# Patient Record
Sex: Female | Born: 1975
Health system: Southern US, Community
[De-identification: ages and names within clinical notes are randomized; demographics above are authoritative.]

## PROBLEM LIST (undated history)

## (undated) DIAGNOSIS — N189 Chronic kidney disease, unspecified: Secondary | ICD-10-CM

## (undated) DIAGNOSIS — R51 Headache: Secondary | ICD-10-CM

## (undated) DIAGNOSIS — F419 Anxiety disorder, unspecified: Secondary | ICD-10-CM

## (undated) DIAGNOSIS — K5790 Diverticulosis of intestine, part unspecified, without perforation or abscess without bleeding: Secondary | ICD-10-CM

## (undated) DIAGNOSIS — A630 Anogenital (venereal) warts: Secondary | ICD-10-CM

## (undated) DIAGNOSIS — G8929 Other chronic pain: Secondary | ICD-10-CM

## (undated) DIAGNOSIS — M1990 Unspecified osteoarthritis, unspecified site: Secondary | ICD-10-CM

## (undated) DIAGNOSIS — L905 Scar conditions and fibrosis of skin: Secondary | ICD-10-CM

## (undated) DIAGNOSIS — Z860100 Personal history of colon polyps, unspecified: Secondary | ICD-10-CM

## (undated) DIAGNOSIS — T7840XA Allergy, unspecified, initial encounter: Secondary | ICD-10-CM

## (undated) DIAGNOSIS — R519 Headache, unspecified: Secondary | ICD-10-CM

## (undated) HISTORY — PX: DENTAL SURGERY: SHX609

## (undated) HISTORY — DX: Other chronic pain: G89.29

## (undated) HISTORY — PX: OTHER SURGICAL HISTORY: SHX169

## (undated) HISTORY — DX: Chronic kidney disease, unspecified: N18.9

## (undated) HISTORY — DX: Headache: R51

## (undated) HISTORY — DX: Unspecified osteoarthritis, unspecified site: M19.90

## (undated) HISTORY — PX: BIOPSY BOWEL: PRO7

## (undated) HISTORY — DX: Headache, unspecified: R51.9

## (undated) HISTORY — DX: Anxiety disorder, unspecified: F41.9

## (undated) HISTORY — DX: Scar conditions and fibrosis of skin: L90.5

## (undated) HISTORY — DX: Allergy, unspecified, initial encounter: T78.40XA

## (undated) HISTORY — DX: Diverticulosis of intestine, part unspecified, without perforation or abscess without bleeding: K57.90

---
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
## 1998-07-25 ENCOUNTER — Emergency Department (HOSPITAL_COMMUNITY): Admission: EM | Admit: 1998-07-25 | Discharge: 1998-07-25 | Payer: Self-pay | Admitting: *Deleted

## 1998-09-03 ENCOUNTER — Ambulatory Visit (HOSPITAL_COMMUNITY): Admission: RE | Admit: 1998-09-03 | Discharge: 1998-09-03 | Payer: Self-pay | Admitting: Obstetrics

## 1998-09-13 ENCOUNTER — Inpatient Hospital Stay (HOSPITAL_COMMUNITY): Admission: AD | Admit: 1998-09-13 | Discharge: 1998-09-13 | Payer: Self-pay | Admitting: *Deleted

## 1998-09-27 ENCOUNTER — Ambulatory Visit (HOSPITAL_COMMUNITY): Admission: RE | Admit: 1998-09-27 | Discharge: 1998-09-27 | Payer: Self-pay | Admitting: *Deleted

## 1998-10-27 ENCOUNTER — Inpatient Hospital Stay (HOSPITAL_COMMUNITY): Admission: AD | Admit: 1998-10-27 | Discharge: 1998-10-29 | Payer: Self-pay | Admitting: *Deleted

## 1998-11-01 ENCOUNTER — Inpatient Hospital Stay (HOSPITAL_COMMUNITY): Admission: AD | Admit: 1998-11-01 | Discharge: 1998-11-17 | Payer: Self-pay | Admitting: Obstetrics & Gynecology

## 1998-11-01 ENCOUNTER — Encounter: Payer: Self-pay | Admitting: Obstetrics & Gynecology

## 1999-02-01 ENCOUNTER — Encounter: Admission: RE | Admit: 1999-02-01 | Discharge: 1999-02-01 | Payer: Self-pay | Admitting: Obstetrics & Gynecology

## 1999-02-18 ENCOUNTER — Ambulatory Visit (HOSPITAL_COMMUNITY): Admission: RE | Admit: 1999-02-18 | Discharge: 1999-02-18 | Payer: Self-pay | Admitting: Obstetrics & Gynecology

## 1999-03-06 ENCOUNTER — Inpatient Hospital Stay (HOSPITAL_COMMUNITY): Admission: AD | Admit: 1999-03-06 | Discharge: 1999-03-06 | Payer: Self-pay | Admitting: Obstetrics

## 2000-01-25 ENCOUNTER — Encounter: Payer: Self-pay | Admitting: Emergency Medicine

## 2000-01-25 ENCOUNTER — Emergency Department (HOSPITAL_COMMUNITY): Admission: EM | Admit: 2000-01-25 | Discharge: 2000-01-25 | Payer: Self-pay | Admitting: Emergency Medicine

## 2000-02-12 ENCOUNTER — Encounter: Admission: RE | Admit: 2000-02-12 | Discharge: 2000-02-12 | Payer: Self-pay | Admitting: Family Medicine

## 2000-04-06 ENCOUNTER — Encounter: Admission: RE | Admit: 2000-04-06 | Discharge: 2000-04-20 | Payer: Self-pay | Admitting: Orthopedic Surgery

## 2000-04-27 ENCOUNTER — Encounter: Admission: RE | Admit: 2000-04-27 | Discharge: 2000-04-27 | Payer: Self-pay | Admitting: Family Medicine

## 2000-10-05 ENCOUNTER — Encounter: Admission: RE | Admit: 2000-10-05 | Discharge: 2000-10-05 | Payer: Self-pay | Admitting: Family Medicine

## 2000-10-09 ENCOUNTER — Encounter: Admission: RE | Admit: 2000-10-09 | Discharge: 2000-10-09 | Payer: Self-pay | Admitting: Family Medicine

## 2000-12-26 ENCOUNTER — Emergency Department (HOSPITAL_COMMUNITY): Admission: EM | Admit: 2000-12-26 | Discharge: 2000-12-26 | Payer: Self-pay | Admitting: Emergency Medicine

## 2000-12-27 ENCOUNTER — Emergency Department (HOSPITAL_COMMUNITY): Admission: EM | Admit: 2000-12-27 | Discharge: 2000-12-27 | Payer: Self-pay | Admitting: Emergency Medicine

## 2000-12-28 ENCOUNTER — Encounter: Admission: RE | Admit: 2000-12-28 | Discharge: 2000-12-28 | Payer: Self-pay | Admitting: Family Medicine

## 2000-12-31 ENCOUNTER — Encounter: Admission: RE | Admit: 2000-12-31 | Discharge: 2000-12-31 | Payer: Self-pay | Admitting: Family Medicine

## 2001-01-04 ENCOUNTER — Encounter: Admission: RE | Admit: 2001-01-04 | Discharge: 2001-01-04 | Payer: Self-pay | Admitting: Sports Medicine

## 2001-03-16 ENCOUNTER — Emergency Department (HOSPITAL_COMMUNITY): Admission: EM | Admit: 2001-03-16 | Discharge: 2001-03-16 | Payer: Self-pay | Admitting: Emergency Medicine

## 2001-04-27 ENCOUNTER — Encounter: Admission: RE | Admit: 2001-04-27 | Discharge: 2001-04-27 | Payer: Self-pay | Admitting: Family Medicine

## 2001-07-30 ENCOUNTER — Encounter: Admission: RE | Admit: 2001-07-30 | Discharge: 2001-07-30 | Payer: Self-pay | Admitting: Family Medicine

## 2001-10-20 ENCOUNTER — Encounter: Payer: Self-pay | Admitting: *Deleted

## 2001-10-20 ENCOUNTER — Encounter: Admission: RE | Admit: 2001-10-20 | Discharge: 2001-10-20 | Payer: Self-pay | Admitting: *Deleted

## 2001-12-01 ENCOUNTER — Encounter: Payer: Self-pay | Admitting: Emergency Medicine

## 2001-12-01 ENCOUNTER — Emergency Department (HOSPITAL_COMMUNITY): Admission: EM | Admit: 2001-12-01 | Discharge: 2001-12-01 | Payer: Self-pay | Admitting: Emergency Medicine

## 2002-03-22 ENCOUNTER — Encounter: Admission: RE | Admit: 2002-03-22 | Discharge: 2002-03-22 | Payer: Self-pay | Admitting: Family Medicine

## 2002-07-26 ENCOUNTER — Emergency Department (HOSPITAL_COMMUNITY): Admission: EM | Admit: 2002-07-26 | Discharge: 2002-07-26 | Payer: Self-pay | Admitting: Emergency Medicine

## 2003-01-13 ENCOUNTER — Emergency Department (HOSPITAL_COMMUNITY): Admission: EM | Admit: 2003-01-13 | Discharge: 2003-01-13 | Payer: Self-pay | Admitting: Emergency Medicine

## 2003-01-13 ENCOUNTER — Encounter: Admission: RE | Admit: 2003-01-13 | Discharge: 2003-01-13 | Payer: Self-pay | Admitting: Family Medicine

## 2003-01-13 ENCOUNTER — Encounter: Payer: Self-pay | Admitting: Emergency Medicine

## 2003-04-11 ENCOUNTER — Emergency Department (HOSPITAL_COMMUNITY): Admission: EM | Admit: 2003-04-11 | Discharge: 2003-04-11 | Payer: Self-pay | Admitting: Emergency Medicine

## 2003-08-02 ENCOUNTER — Encounter: Admission: RE | Admit: 2003-08-02 | Discharge: 2003-08-02 | Payer: Self-pay | Admitting: Family Medicine

## 2003-12-20 ENCOUNTER — Emergency Department (HOSPITAL_COMMUNITY): Admission: EM | Admit: 2003-12-20 | Discharge: 2003-12-20 | Payer: Self-pay | Admitting: Emergency Medicine

## 2004-01-01 ENCOUNTER — Encounter: Admission: RE | Admit: 2004-01-01 | Discharge: 2004-01-01 | Payer: Self-pay | Admitting: Family Medicine

## 2004-01-08 ENCOUNTER — Encounter: Admission: RE | Admit: 2004-01-08 | Discharge: 2004-01-08 | Payer: Self-pay | Admitting: Family Medicine

## 2004-06-18 ENCOUNTER — Encounter: Admission: RE | Admit: 2004-06-18 | Discharge: 2004-06-18 | Payer: Self-pay | Admitting: Sports Medicine

## 2004-07-19 ENCOUNTER — Encounter: Admission: RE | Admit: 2004-07-19 | Discharge: 2004-07-19 | Payer: Self-pay | Admitting: Family Medicine

## 2004-07-19 ENCOUNTER — Ambulatory Visit: Payer: Self-pay | Admitting: Family Medicine

## 2004-07-23 ENCOUNTER — Encounter: Admission: RE | Admit: 2004-07-23 | Discharge: 2004-07-23 | Payer: Self-pay | Admitting: Family Medicine

## 2004-10-24 ENCOUNTER — Ambulatory Visit: Payer: Self-pay | Admitting: Family Medicine

## 2004-11-19 ENCOUNTER — Ambulatory Visit: Payer: Self-pay | Admitting: Sports Medicine

## 2005-01-11 ENCOUNTER — Emergency Department (HOSPITAL_COMMUNITY): Admission: EM | Admit: 2005-01-11 | Discharge: 2005-01-11 | Payer: Self-pay | Admitting: Family Medicine

## 2005-02-25 ENCOUNTER — Encounter: Admission: RE | Admit: 2005-02-25 | Discharge: 2005-02-25 | Payer: Self-pay | Admitting: Family Medicine

## 2005-06-26 ENCOUNTER — Encounter: Admission: RE | Admit: 2005-06-26 | Discharge: 2005-06-26 | Payer: Self-pay | Admitting: Occupational Medicine

## 2005-07-02 ENCOUNTER — Emergency Department (HOSPITAL_COMMUNITY): Admission: EM | Admit: 2005-07-02 | Discharge: 2005-07-02 | Payer: Self-pay | Admitting: Emergency Medicine

## 2005-09-25 ENCOUNTER — Ambulatory Visit: Payer: Self-pay | Admitting: Family Medicine

## 2005-10-23 ENCOUNTER — Ambulatory Visit: Payer: Self-pay | Admitting: Family Medicine

## 2005-12-15 ENCOUNTER — Encounter (INDEPENDENT_AMBULATORY_CARE_PROVIDER_SITE_OTHER): Payer: Self-pay | Admitting: *Deleted

## 2005-12-15 LAB — CONVERTED CEMR LAB

## 2006-01-05 ENCOUNTER — Ambulatory Visit: Payer: Self-pay | Admitting: Sports Medicine

## 2006-02-01 ENCOUNTER — Emergency Department (HOSPITAL_COMMUNITY): Admission: EM | Admit: 2006-02-01 | Discharge: 2006-02-01 | Payer: Self-pay | Admitting: Family Medicine

## 2006-02-02 ENCOUNTER — Emergency Department (HOSPITAL_COMMUNITY): Admission: EM | Admit: 2006-02-02 | Discharge: 2006-02-02 | Payer: Self-pay | Admitting: Emergency Medicine

## 2006-06-25 ENCOUNTER — Emergency Department (HOSPITAL_COMMUNITY): Admission: EM | Admit: 2006-06-25 | Discharge: 2006-06-25 | Payer: Self-pay | Admitting: Family Medicine

## 2006-12-17 ENCOUNTER — Ambulatory Visit: Payer: Self-pay | Admitting: Family Medicine

## 2006-12-17 ENCOUNTER — Encounter: Admission: RE | Admit: 2006-12-17 | Discharge: 2006-12-17 | Payer: Self-pay | Admitting: Family Medicine

## 2006-12-23 ENCOUNTER — Ambulatory Visit: Payer: Self-pay | Admitting: Family Medicine

## 2007-02-11 DIAGNOSIS — R519 Headache, unspecified: Secondary | ICD-10-CM | POA: Insufficient documentation

## 2007-02-11 DIAGNOSIS — L732 Hidradenitis suppurativa: Secondary | ICD-10-CM

## 2007-02-11 DIAGNOSIS — F17200 Nicotine dependence, unspecified, uncomplicated: Secondary | ICD-10-CM | POA: Insufficient documentation

## 2007-02-11 DIAGNOSIS — F411 Generalized anxiety disorder: Secondary | ICD-10-CM | POA: Insufficient documentation

## 2007-02-11 DIAGNOSIS — R51 Headache: Secondary | ICD-10-CM

## 2007-02-11 DIAGNOSIS — J309 Allergic rhinitis, unspecified: Secondary | ICD-10-CM | POA: Insufficient documentation

## 2007-02-11 DIAGNOSIS — M542 Cervicalgia: Secondary | ICD-10-CM | POA: Insufficient documentation

## 2007-02-12 ENCOUNTER — Encounter (INDEPENDENT_AMBULATORY_CARE_PROVIDER_SITE_OTHER): Payer: Self-pay | Admitting: *Deleted

## 2007-03-19 ENCOUNTER — Emergency Department (HOSPITAL_COMMUNITY): Admission: EM | Admit: 2007-03-19 | Discharge: 2007-03-19 | Payer: Self-pay | Admitting: Family Medicine

## 2007-12-15 ENCOUNTER — Ambulatory Visit: Payer: Self-pay | Admitting: Family Medicine

## 2007-12-15 DIAGNOSIS — L989 Disorder of the skin and subcutaneous tissue, unspecified: Secondary | ICD-10-CM | POA: Insufficient documentation

## 2007-12-27 ENCOUNTER — Telehealth: Payer: Self-pay | Admitting: *Deleted

## 2007-12-28 ENCOUNTER — Encounter (INDEPENDENT_AMBULATORY_CARE_PROVIDER_SITE_OTHER): Payer: Self-pay | Admitting: Family Medicine

## 2008-01-03 ENCOUNTER — Encounter (INDEPENDENT_AMBULATORY_CARE_PROVIDER_SITE_OTHER): Payer: Self-pay | Admitting: Family Medicine

## 2008-12-30 ENCOUNTER — Emergency Department (HOSPITAL_COMMUNITY): Admission: EM | Admit: 2008-12-30 | Discharge: 2008-12-30 | Payer: Self-pay | Admitting: Emergency Medicine

## 2009-01-04 ENCOUNTER — Emergency Department (HOSPITAL_COMMUNITY): Admission: EM | Admit: 2009-01-04 | Discharge: 2009-01-04 | Payer: Self-pay | Admitting: Emergency Medicine

## 2009-04-19 ENCOUNTER — Telehealth: Payer: Self-pay | Admitting: *Deleted

## 2009-05-02 ENCOUNTER — Ambulatory Visit: Payer: Self-pay | Admitting: Family Medicine

## 2009-05-02 ENCOUNTER — Encounter (INDEPENDENT_AMBULATORY_CARE_PROVIDER_SITE_OTHER): Payer: Self-pay | Admitting: Family Medicine

## 2009-05-02 ENCOUNTER — Encounter: Admission: RE | Admit: 2009-05-02 | Discharge: 2009-05-02 | Payer: Self-pay | Admitting: Family Medicine

## 2009-05-02 DIAGNOSIS — R109 Unspecified abdominal pain: Secondary | ICD-10-CM

## 2009-05-02 LAB — CONVERTED CEMR LAB
Bilirubin Urine: NEGATIVE
Nitrite: NEGATIVE
Protein, U semiquant: NEGATIVE
Urobilinogen, UA: 0.2

## 2009-05-19 ENCOUNTER — Emergency Department (HOSPITAL_COMMUNITY): Admission: EM | Admit: 2009-05-19 | Discharge: 2009-05-20 | Payer: Self-pay | Admitting: Emergency Medicine

## 2010-06-29 ENCOUNTER — Emergency Department (HOSPITAL_COMMUNITY): Admission: EM | Admit: 2010-06-29 | Discharge: 2010-06-30 | Payer: Self-pay | Admitting: Emergency Medicine

## 2010-08-26 ENCOUNTER — Encounter: Payer: Self-pay | Admitting: Family Medicine

## 2010-08-27 ENCOUNTER — Ambulatory Visit: Payer: Self-pay | Admitting: Family Medicine

## 2010-08-27 ENCOUNTER — Encounter: Admission: RE | Admit: 2010-08-27 | Discharge: 2010-08-27 | Payer: Self-pay | Admitting: Sports Medicine

## 2010-08-27 DIAGNOSIS — IMO0002 Reserved for concepts with insufficient information to code with codable children: Secondary | ICD-10-CM | POA: Insufficient documentation

## 2010-08-27 DIAGNOSIS — G5620 Lesion of ulnar nerve, unspecified upper limb: Secondary | ICD-10-CM

## 2010-09-04 ENCOUNTER — Telehealth: Payer: Self-pay | Admitting: *Deleted

## 2010-09-10 ENCOUNTER — Ambulatory Visit: Payer: Self-pay | Admitting: Family Medicine

## 2010-11-24 ENCOUNTER — Emergency Department (HOSPITAL_COMMUNITY)
Admission: EM | Admit: 2010-11-24 | Discharge: 2010-11-25 | Payer: Self-pay | Source: Home / Self Care | Admitting: Emergency Medicine

## 2010-11-27 ENCOUNTER — Emergency Department (HOSPITAL_COMMUNITY)
Admission: EM | Admit: 2010-11-27 | Discharge: 2010-11-27 | Payer: Self-pay | Source: Home / Self Care | Admitting: Emergency Medicine

## 2010-12-03 ENCOUNTER — Ambulatory Visit: Payer: Self-pay | Admitting: Family Medicine

## 2010-12-03 DIAGNOSIS — A088 Other specified intestinal infections: Secondary | ICD-10-CM | POA: Insufficient documentation

## 2011-01-14 NOTE — Miscellaneous (Signed)
Summary: back pain  Clinical Lists Changes states she has terrible back pain but has not been able to come in due to lack of insurance. now has medicaid . has been using ibuprofen for pain. no appt left until tomorrow. she will be here at 8:30.  she declined use of UC.Golden Circle RN  August 26, 2010 11:26 AM  Will follow in clinic. Bobby Rumpf  MD  August 26, 2010 11:44 AM   Appended Document: back pain she did not keep appt this am. states she overslept. resched for 9:45 with Dr. Karie Schwalbe & discussed the dnkas again. told her she would be getting a probation letter

## 2011-01-14 NOTE — Progress Notes (Signed)
  Phone Note Call from Patient   Caller: Patient Call For: [phone] Summary of Call: Please call pt back with Xray results. Initial call taken by: Britta Mccreedy mcgregor  Follow-up for Phone Call        Mild arthritis.  Nothing else.  Should do what we discussed: Pred 12 day pack. Neurontin up-taper. Rehab exercises printed from sports med advisor. RTC 2 weeks to see how she's doing. Follow-up by: Rodney Langton MD,  September 04, 2010 8:52 PM  Additional Follow-up for Phone Call Additional follow up Details #1::        gave pt above info. she had no further questions Additional Follow-up by: Golden Circle RN,  September 05, 2010 9:19 AM

## 2011-01-14 NOTE — Assessment & Plan Note (Signed)
Summary: F/U PER DR T/KH   Vital Signs:  Patient profile:   35 year old female Weight:      180.5 pounds Temp:     98.6 degrees F oral Pulse rate:   88 / minute Pulse rhythm:   regular BP sitting:   121 / 92  (right arm) Cuff size:   regular  Vitals Entered By: Loralee Pacas CMA (September 10, 2010 2:06 PM)   Primary Care Provider:  Bobby Rumpf  MD   History of Present Illness: 35 yo female here for fu of radicular LBP.  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  Pain is worst at R low back paraspinal with paresthesias going down lateral aspect of R thigh.  No symptoms going down into foot.  No trauma.  No bowel/bladder problems.  Pain worse with spine extension but also present with flexion.  Has been using aspirin only.  No weakness in leg.    XR showed mild degenerative changes, zygapophyseal joints aligned, neural foramina open on oblique views.  Mild anterior lipping/degenerativechanges.  Finishing prednisone taper and on neurontin.  Symptoms only a little better.  Finds that rehab exercises help the most.  Habits & Providers  Alcohol-Tobacco-Diet     Tobacco Status: current     Tobacco Counseling: to quit use of tobacco products     Cigarette Packs/Day: 1.0  Current Medications (verified): 1)  Allegra-D 12 Hour 60-120 Mg Tb12 (Fexofenadine-Pseudoephedrine) .... Take 1 Tablet By Mouth Twice A Day 2)  Promethazine Hcl 12.5 Mg  Tabs (Promethazine Hcl) .... Take One Tablet Every 6-8 Hours As Needed Nausea. 3)  Midrin 325-65-100 Mg  Caps (Apap-Isometheptene-Dichloral) .... Take 2 Capsule By Mouth X 1 Then One Capule Every Hour As Needed To A Max of 5 Per 12 Hours 4)  Prednisone (Pak) 10 Mg Tabs (Prednisone) .... Use 12 Day Pack As Directed. 5)  Neurontin 300 Mg Caps (Gabapentin) .... One Tab By Mouth Qam, One Tab By Mouth At Lunch, Then 2 Tabs At Bedtime. 6)  Amitriptyline Hcl 25 Mg Tabs (Amitriptyline Hcl) .... One Tab By Mouth Qhs X  7d, Then Increase To 50mg  Tabs Qhs. 7)  Amitriptyline Hcl 50 Mg Tabs (Amitriptyline Hcl) .... One Tab By Mouth Qhs  Allergies (verified): No Known Drug Allergies  Review of Systems       See HPI  Physical Exam  General:  Well-developed,well-nourished,in no acute distress; alert,appropriate and cooperative throughout examination Msk:  Normal to inspection, TTP R paraspinal muscles.  DTRs 2+in lower ext.  Strength 5/5 in lower ext, gait normal.  Seated and laying straight leg raise reproduce symptoms of paresthesias.  Extreme flexion of hip actually improves pain.  Pt has approx 5 deg of extension limited by pain.  Flexion to 90 deg.  Side bending and rotation normal.   Impression & Recommendations:  Problem # 1:  BACK PAIN, LUMBAR, WITH RADICULOPATHY (ICD-724.4) Assessment Improved Radiculopathy in L3/L4 distribution/lateral femoral cutaneous nerve, XR not suggestive of foraminal stenosis in the area. Symptoms improved with treatment. Pt unable to do light duty at work. Will further increase neurontin to 300, 300, 600 mg. Adding amytryptiline. RTC 2 weeks.  Orders: [facility]- Est  Level 4 (16109)  Problem # 2:  CUBITAL TUNNEL SYNDROME (ICD-354.2) Assessment: Improved Somewhat improved.  Orders: [facility]- Est  Level 4 (99214)  Complete Medication List: 1)  Allegra-d 12 Hour 60-120 Mg Tb12 (Fexofenadine-pseudoephedrine) .... Take 1 tablet by mouth twice  a day 2)  Promethazine Hcl 12.5 Mg Tabs (Promethazine hcl) .... Take one tablet every 6-8 hours as needed nausea. 3)  Midrin 325-65-100 Mg Caps (Apap-isometheptene-dichloral) .... Take 2 capsule by mouth x 1 then one capule every hour as needed to a max of 5 per 12 hours 4)  Prednisone (pak) 10 Mg Tabs (Prednisone) .... Use 12 day pack as directed. 5)  Neurontin 300 Mg Caps (Gabapentin) .... One tab by mouth qam, one tab by mouth at lunch, then 2 tabs at bedtime. 6)  Amitriptyline Hcl 25 Mg Tabs (Amitriptyline hcl) .... One tab by mouth  qhs x 7d, then increase to 50mg  tabs qhs. 7)  Amitriptyline Hcl 50 Mg Tabs (Amitriptyline hcl) .... One tab by mouth qhs  Patient Instructions: 1)  Great to see you, 2)  Increase your Neurontin to 1 tab in the morning, 1 midday, and 2 at bedtime. 3)  Start taking amitryptiline as below. 4)  Call and let me know how you are doing in 2 weeks. 5)  -Dr. Karie Schwalbe. Prescriptions: AMITRIPTYLINE HCL 50 MG TABS (AMITRIPTYLINE HCL) One tab by mouth qHS  #60 x 0   Entered and Authorized by:   Rodney Langton MD   Signed by:   Rodney Langton MD on 09/10/2010   Method used:   Electronically to        [facility]  [address] [phone][facility] (retail)       [address]       [place], [place]  [address]       Ph: [phone]       Fax: [phone]   RxID:   [id number] AMITRIPTYLINE HCL 25 MG TABS (AMITRIPTYLINE HCL) One tab by mouth qHS x 7d, then increase to 50mg  tabs qHS.  #7 x 0   Entered and Authorized by:   Rodney Langton MD   Signed by:   Rodney Langton MD on 09/10/2010   Method used:   [facility]        [facility]  [address] [phone]* (retail)       [address]       [place], [place]  [address]       Ph: [phone]       Fax: [phone]   RxID:   [id number]

## 2011-01-14 NOTE — Assessment & Plan Note (Signed)
Summary: back pain/[place]/carew   Vital Signs:  Patient profile:   35 year old female Height:      64 inches Weight:      183 pounds BMI:     31.53 Temp:     97.6 degrees F oral Pulse rate:   87 / minute BP sitting:   113 / 79  (left arm) Cuff size:   regular  Vitals Entered By: Garen Grams LPN (August 27, 2010 9:54 AM) CC: low back pain x 1 yr Is Patient Diabetic? No Pain Assessment Patient in pain? yes     Location: lower back Intensity: 7   Primary Care Provider:  Alanda Amass MD  CC:  low back pain x 1 yr.  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  Pain is worst at R low back paraspinal with paresthesias going down lateral aspect of R thigh for the last month.  No symptoms going down into foot.  No trauma.  No bowel/bladder problems.  Pain worse with spine extension but also present with flexion.  Has been using aspirin only.  No weakness in leg.    Also wakes up with L pinkie and ring fingers numb.  Habits & Providers  Alcohol-Tobacco-Diet     Tobacco Status: current     Tobacco Counseling: to quit use of tobacco products     Cigarette Packs/Day: 1.0  Current Medications (verified): 1)  Allegra-D 12 Hour 60-120 Mg Tb12 (Fexofenadine-Pseudoephedrine) .... Take 1 Tablet By Mouth Twice A Day 2)  Promethazine Hcl 12.5 Mg  Tabs (Promethazine Hcl) .... Take One Tablet Every 6-8 Hours As Needed Nausea. 3)  Midrin 325-65-100 Mg  Caps (Apap-Isometheptene-Dichloral) .... Take 2 Capsule By Mouth X 1 Then One Capule Every Hour As Needed To A Max of 5 Per 12 Hours 4)  Prednisone (Pak) 10 Mg Tabs (Prednisone) .... Use 12 Day Pack As Directed. 5)  Neurontin 300 Mg Caps (Gabapentin) .... One Tab By Mouth Qhs On Day 1, Then By Mouth Two Times A Day On Day 2, Then Three Times A Day.  Allergies (verified): No Known Drug Allergies  Social History: Packs/Day:  1.0  Review of Systems       See  HPI  Physical Exam  General:  Well-developed,well-nourished,in no acute distress; alert,appropriate and cooperative throughout examination Lungs:  Normal respiratory effort, chest expands symmetrically. Lungs are clear to auscultation, no crackles or wheezes. Heart:  Normal rate and regular rhythm. S1 and S2 normal without gallop, murmur, click, rub or other extra sounds. Msk:  Normal to inspection, TTP R paraspinal muscles.  DTRs 2+in lower ext.  Strength 5/5 in lower ext, gait normal.  Seated and laying straight leg raise reproduce symptoms of paresthesias.  Extreme flexion of hip actually improves pain.  Pt has approx 5 deg of extension limited by pain.  Flexion to 90 deg.  Side bending and rotation normal.    Impression & Recommendations:  Problem # 1:  BACK PAIN, LUMBAR, WITH RADICULOPATHY (ICD-724.4) Assessment New Radicular pain in lateral cutaneous nerve of the thigh distribution.  Pred 12 day pack. Neurontin up-taper. XR showed mild degenerative changes. Rehab exercises printed from sports med advisor. RTC 2 weeks to see how she's doing.  Problem # 2:  CUBITAL TUNNEL SYNDROME (ICD-354.2) Assessment: New Pain, tingling, numbness in the mornings in the ulnar distribution likely cubital tunnel.   Can try braces to keep elbow straight at  night. Avoid resting on nerve. Pred pack and neurontin will likely help this as well. If intractable would need referral for NCV/EMG and ulnar nerve transposition.  Complete Medication List: 1)  Allegra-d 12 Hour 60-120 Mg Tb12 (Fexofenadine-pseudoephedrine) .... Take 1 tablet by mouth twice a day 2)  Promethazine Hcl 12.5 Mg Tabs (Promethazine hcl) .... Take one tablet every 6-8 hours as needed nausea. 3)  Midrin 325-65-100 Mg Caps (Apap-isometheptene-dichloral) .... Take 2 capsule by mouth x 1 then one capule every hour as needed to a max of 5 per 12 hours 4)  Prednisone (pak) 10 Mg Tabs (Prednisone) .... Use 12 day pack as directed. 5)   Neurontin 300 Mg Caps (Gabapentin) .... One tab by mouth qhs on day 1, then by mouth two times a day on day 2, then three times a day.  Other Orders: [facility]- Est  Level 4 (03474) Diagnostic X-Ray/Fluoroscopy (Diagnostic X-Ray/Flu)  Patient Instructions: 1)  See below med list. 2)  Do rehab exercises. 3)  get xrays 4)  come back to see me in 2 weeks to see if you are any better. 5)  -Dr. Karie Schwalbe. Prescriptions: NEURONTIN 300 MG CAPS (GABAPENTIN) One tab by mouth qHS on day 1, then by mouth two times a day on day 2, then three times a day.  #60 x 0   Entered and Authorized by:   Rodney Langton MD   Signed by:   Rodney Langton MD on 08/27/2010   Method used:   [facility] to        [facility]  [address] [phone]* (retail)       [address]       [place], [place]  [address]       Ph: [phone]       Fax: [phone]   RxID:   [id number] PREDNISONE (PAK) 10 MG TABS (PREDNISONE) Use 12 day pack as directed.  #1 x 0   Entered and Authorized by:   Rodney Langton MD   Signed by:   Rodney Langton MD on 08/27/2010   Method used:   [facility]        [facility]  [address] [phone]* (retail)       [address]       [place], [place]  [address]       Ph: [phone]       Fax: [phone]   RxID:   [id number]

## 2011-01-16 NOTE — Assessment & Plan Note (Signed)
Summary: bowel prob,df   Vital Signs:  Patient profile:   35 year old female Height:      64 inches Weight:      178 pounds BMI:     30.66 Temp:     97.9 degrees F oral Pulse rate:   100 / minute BP sitting:   118 / 88  (left arm) Cuff size:   regular  Vitals Entered By: Garen Grams LPN (December 03, 2010 1:46 PM) CC: n/v, abd pain x 1 week Is Patient Diabetic? No Pain Assessment Patient in pain? yes     Location: abdomen Intensity: 8 Type: cramping   Primary Care Provider:  Bobby Rumpf  MD  CC:  n/v and abd pain x 1 week.  History of Present Illness: 1) Nausea / emesis: Reports that she has has some nausea and emesis (non-bloody, non bilious) that started 1 week ago - she developed a migraine after continued emesis and went to the ER. Treated with headache cocktail and antiemetics. Reports diffuse abdominal cramping associated with her emesis - this is improving. Developed constipation about 3-4 days after and thus took a stool softener for two days - had a normal bowel movement yesterday, but has had three loose stools today (non-bloody). Has been having primarily liquid diet and yogurt for fear of triggering more abdominal cramping.    ROS: Denies URI symptoms, sick contacts, new foods, travel, muscle pain, dysuria   Habits & Providers  Alcohol-Tobacco-Diet     Tobacco Status: current     Tobacco Counseling: to quit use of tobacco products     Cigarette Packs/Day: 0.5  Current Medications (verified): 1)  Allegra-D 12 Hour 60-120 Mg Tb12 (Fexofenadine-Pseudoephedrine) .... Take 1 Tablet By Mouth Twice A Day 2)  Midrin 325-65-100 Mg  Caps (Apap-Isometheptene-Dichloral) .... Take 2 Capsule By Mouth X 1 Then One Capule Every Hour As Needed To A Max of 5 Per 12 Hours 3)  Neurontin 300 Mg Caps (Gabapentin) .... One Tab By Mouth Qam, One Tab By Mouth At Lunch, Then 2 Tabs At Bedtime. 4)  Amitriptyline Hcl 50 Mg Tabs (Amitriptyline Hcl) .... One Tab By Mouth Qhs 5)  Zofran 4  Mg Tabs (Ondansetron Hcl) .... One Tab By Mouth Q4 Hrs As Needed Nausea  Allergies (verified): No Known Drug Allergies  Social History: Packs/Day:  0.5  Physical Exam  General:  overweight, NAD, pleasant, well appearing  Mouth:  moist membranes  Lungs:  CTAB w/o wheeze or crackles  Heart:  RRR, no murmurs  Abdomen:  soft and non-tender, hyperactive bowel sounds, no masses, no rebound, no guarding     Impression & Recommendations:  Problem # 1:  GASTROENTERITIS, VIRAL (ICD-008.8) Assessment New  Likely viral gastroenteritis. AFVSS. Well appearing. Symptoms improving. Refilled anti-emetics given in ER. Advised regarding use of Imodium. Red flags that would prompt return to care were reviewed with patient and patient expressed understanding. Follow up as needed.   Orders: [facility]- Est Level  3 (99213)  Complete Medication List: 1)  Allegra-d 12 Hour 60-120 Mg Tb12 (Fexofenadine-pseudoephedrine) .... Take 1 tablet by mouth twice a day 2)  Midrin 325-65-100 Mg Caps (Apap-isometheptene-dichloral) .... Take 2 capsule by mouth x 1 then one capule every hour as needed to a max of 5 per 12 hours 3)  Neurontin 300 Mg Caps (Gabapentin) .... One tab by mouth qam, one tab by mouth at lunch, then 2 tabs at bedtime. 4)  Amitriptyline Hcl 50 Mg Tabs (Amitriptyline hcl) .... One tab  by mouth qhs 5)  Zofran 4 Mg Tabs (Ondansetron hcl) .... One tab by mouth q4 hrs as needed nausea Prescriptions: ZOFRAN 4 MG TABS (ONDANSETRON HCL) one tab by mouth q4 hrs as needed nausea  #10 x 0   Entered and Authorized by:   Bobby Rumpf  MD   Signed by:   Bobby Rumpf  MD on 12/04/2010   Method used:   [facility] to        [facility]  [address] [phone]* (retail)       [address]       [place], [place]  [address]       Ph: [phone]       Fax: [phone]   RxID:   [id number]    Orders Added: 1)  [facility]- Est Level  3 [27253]

## 2011-02-25 LAB — POCT PREGNANCY, URINE: Preg Test, Ur: NEGATIVE

## 2011-03-31 LAB — POCT PREGNANCY, URINE: Preg Test, Ur: NEGATIVE

## 2011-03-31 LAB — URINE MICROSCOPIC-ADD ON

## 2011-03-31 LAB — URINALYSIS, ROUTINE W REFLEX MICROSCOPIC
Leukocytes, UA: NEGATIVE
Specific Gravity, Urine: 1.018 (ref 1.005–1.030)
Urobilinogen, UA: 0.2 mg/dL (ref 0.0–1.0)
pH: 5.5 (ref 5.0–8.0)

## 2011-04-09 ENCOUNTER — Encounter: Payer: Self-pay | Admitting: Family Medicine

## 2011-04-09 ENCOUNTER — Ambulatory Visit: Payer: Self-pay | Admitting: Family Medicine

## 2011-04-09 ENCOUNTER — Ambulatory Visit (INDEPENDENT_AMBULATORY_CARE_PROVIDER_SITE_OTHER): Payer: Medicaid Other | Admitting: Family Medicine

## 2011-04-09 VITALS — BP 113/78 | HR 80 | Temp 98.2°F | Ht 64.0 in | Wt 170.0 lb

## 2011-04-09 DIAGNOSIS — L723 Sebaceous cyst: Secondary | ICD-10-CM

## 2011-04-09 DIAGNOSIS — L720 Epidermal cyst: Secondary | ICD-10-CM | POA: Insufficient documentation

## 2011-04-09 DIAGNOSIS — L732 Hidradenitis suppurativa: Secondary | ICD-10-CM

## 2011-04-09 MED ORDER — DOXYCYCLINE HYCLATE 100 MG PO TABS: 100.0000 mg | ORAL_TABLET | Freq: Two times a day (BID) | ORAL | Status: AC

## 2011-04-09 NOTE — Patient Instructions (Signed)
Use warm compresses to help reduce pain and swelling. Take ibuprofen for pain Take antibiotic doxycycline for the infection.  I am going to refer you for the cyst on your face to a dermatologist (this seems to be an epidermoid cyst) Follow up as needed.    Hidradenitis Suppurativa, Sweat Gland Abscess Hidradenitis suppurativa is a long lasting (chronic), uncommon disease of the sweat glands. With this, boil-like lumps and scarring develop in the groin, some times under the arms (axillae), and under the breasts. It may also uncommonly occur behind the ears, in the crease of the buttocks, and around the genitals.  CAUSES The cause is from a blocking of the sweat glands. They then become infected. It may cause drainage and odor. It is not contagious. So it cannot be given to someone else. It most often shows up in puberty (about 68 to 37 years of age). But it may happen much later. It is similar to acne which is a disease of the sweat glands. This condition is slightly more common in African-Americans and women. SYMPTOMS  Hidradenitis usually starts as one or more red, tender, swellings in the groin or under the arms (axilla).   Over a period of hours to days the lesions get larger. They often open to the skin surface, draining clear to yellow-colored fluid.   The infected area heals with scarring.  DIAGNOSIS Your caregiver makes this diagnosis by looking at you. Sometimes cultures (growing germs on plates in the lab) may be taken. This is to see what germ (bacterium) is causing the infection.  TREATMENT & HOME CARE INSTRUCTIONS  Topical germ killing medicine applied to the skin (antibiotics) are the treatment of choice. Antibiotics taken by mouth (systemic) are sometimes needed when the condition is getting worse or is severe.   Avoid tight-fitting clothing which traps moisture in.   Dirt does not cause hidradenitis and it is not caused by poor hygiene.   Involved areas should be cleaned  daily using an antibacterial soap. Some patients find that the liquid form of Lever 2000, applied to the involved areas as a lotion after bathing, can help reduce the odor related to this condition.   Sometimes surgery is needed to drain infected areas or remove scarred tissue. Removal of large amounts of tissue is used only in severe cases.   Birth control pills may be helpful.   Oral retinoids (vitamin A derivatives) for 6 to 12 months which are effective for acne may also help this condition.   Weight loss will improve but not cure hidradenitis. It is made worse by being overweight. But the condition is not caused by being overweight.   This condition is more common in people who have had acne.   It may become worse under stress.  There is no medical cure for hidradenitis. It can be controlled, but not cured. The condition usually continues for years with periods of getting worse and getting better (remission). Document Released: 07/15/2004 Document Re-Released: 09/09/2008 [facility] Patient Information 2011 [place], [place].

## 2011-04-09 NOTE — Assessment & Plan Note (Signed)
Given location on face will refer to dermatology for excision.

## 2011-04-09 NOTE — Assessment & Plan Note (Addendum)
Recurrent. Will not attempt I+D today. Will proceed with doxycycline, warm compresses. Advised if worsening to return. No surgical referral needed at this time.

## 2011-04-09 NOTE — Progress Notes (Signed)
  Subjective:    Patient ID: Katrina Reynolds, female    DOB: 1976/07/18, 35 y.o.   MRN: [id number]  HPI  1) Hidradenitis: Patient with history of hidradenitis of the groin. Reports swelling and redness and pain at left mons for past two months which worsens with menses then improves. Denies fever, drainage, vaginal lesions.   2) Cyst on face: Reports cyst on left cheek x few months. Not painful. Started off as small pimple - she "messed with it" several times. No drainage or fevers.   Pertinent history reviewed   Review of Systems As per HPI      Objective:   Physical Exam General: Pleasant, NAD  Skin: Swelling, erythema and induration without fluctuance at left mons. 1 cm x 3 cm cyst non painful at left cheek - appearance consistent with epidermoid cyst.        Assessment & Plan:

## 2011-05-22 ENCOUNTER — Telehealth: Payer: Self-pay | Admitting: Family Medicine

## 2011-05-22 NOTE — Telephone Encounter (Signed)
Pt needs referral info for dermatologist, would also like their contact numbers.

## 2011-05-22 NOTE — Telephone Encounter (Signed)
Pt informed of appt detail [name], Katrina Reynolds

## 2012-04-22 ENCOUNTER — Encounter: Payer: Self-pay | Admitting: Family Medicine

## 2012-04-22 ENCOUNTER — Ambulatory Visit (INDEPENDENT_AMBULATORY_CARE_PROVIDER_SITE_OTHER): Payer: Medicaid Other | Admitting: Family Medicine

## 2012-04-22 VITALS — BP 132/87 | HR 74 | Temp 98.1°F | Ht 64.0 in | Wt 153.2 lb

## 2012-04-22 DIAGNOSIS — G5620 Lesion of ulnar nerve, unspecified upper limb: Secondary | ICD-10-CM

## 2012-04-22 DIAGNOSIS — J309 Allergic rhinitis, unspecified: Secondary | ICD-10-CM

## 2012-04-22 DIAGNOSIS — IMO0002 Reserved for concepts with insufficient information to code with codable children: Secondary | ICD-10-CM

## 2012-04-22 MED ORDER — FLUTICASONE PROPIONATE 50 MCG/ACT NA SUSP: 2.0000 | Freq: Every day | NASAL | Status: DC

## 2012-04-22 NOTE — Assessment & Plan Note (Signed)
Rx for flonase. 

## 2012-04-22 NOTE — Assessment & Plan Note (Signed)
No red flags, new injuries, or other indications for new imaging. Will refer to pain clinic as she requests, if possible close to her house.  I also am recommending Physical therapy.

## 2012-04-22 NOTE — Progress Notes (Signed)
  Subjective:    Patient ID: Katrina Reynolds, female    DOB: [birth year], 36 y.o.   MRN: [id number]  HPI  Patient presents to clinic today for chronic low back pain.  She has not seen a doctor or taken any medications in more than a year due to issues with insurance.  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  She had imaging then without any acute findings.    She says she tried amitriptyline and gabapentin, that did not help the pain.  She has never been to PT for her back pain.  She says that the pain starts in the small of her back and radiates around her hips to her knees.  She denies red flag symptoms of weakness, incontinence, numbness in feet.  She says sometimes the pain is so bad she just balls up and cries.  She works 10 hour days on her feet at [employer] now, and it is very hard on her back.  No new injuries or falls. She says she wants to go to a pain clinic where it can be a "one stop shop" for her back pain.  It has been bothering her a long time and she does not want to feel this way anymore.   She also complains of numbness and tingling in her hands at night that is concerning for carpel tunnel.  She has difficulty sleeping because of the hand pain.   She has also been having trouble with her allergies- and she admits to using her husband flonase.  She says it helps her breathe better and sleep better.  No fevers, cough, headaches.   Review of Systems Pertinent items in HPI.     Objective:   Physical Exam  Vitals reviewed. Constitutional: She is oriented to person, place, and time. She appears well-developed and well-nourished. No distress.  HENT:  Head: Normocephalic and atraumatic.  Right Ear: External ear normal.  Left Ear: External ear normal.  Nose: Nose normal.  Mouth/Throat: Oropharynx is clear and moist.  Eyes: EOM are normal. Pupils are equal, round, and reactive to light.  Neck: Normal range of motion. Neck supple.    Cardiovascular: Normal rate, regular rhythm and normal heart sounds.   No murmur heard. Pulmonary/Chest: Effort normal and breath sounds normal. She has no wheezes.  Abdominal: Soft.  Musculoskeletal: She exhibits no edema.       Patient has +tenderness to palpation in lumbosacral muscles just lateral to spine.  No spine tenderness.  ROM normal.    Neurological: She is alert and oriented to person, place, and time.       Strength, sensation, and reflexes normal in all 4 extremities.           Assessment & Plan:

## 2012-04-22 NOTE — Patient Instructions (Signed)
It was nice to meet you.  I am sorry your back is causing you so much pain.  I have made referrals to Physical Therapy and the Pain clinic.  The office will contact you with those appointments.

## 2012-04-22 NOTE — Assessment & Plan Note (Signed)
Advised getting wrist splints as she has not tried conservative management for this problem.

## 2012-04-26 ENCOUNTER — Encounter: Payer: Self-pay | Admitting: Physical Medicine and Rehabilitation

## 2012-05-03 ENCOUNTER — Encounter
Payer: Managed Care, Other (non HMO) | Attending: Physical Medicine and Rehabilitation | Admitting: Physical Medicine and Rehabilitation

## 2012-05-03 ENCOUNTER — Encounter: Payer: Self-pay | Admitting: Physical Medicine and Rehabilitation

## 2012-05-03 VITALS — BP 111/74 | HR 91 | Ht 64.0 in | Wt 154.0 lb

## 2012-05-03 DIAGNOSIS — IMO0002 Reserved for concepts with insufficient information to code with codable children: Secondary | ICD-10-CM

## 2012-05-03 DIAGNOSIS — M545 Low back pain, unspecified: Secondary | ICD-10-CM | POA: Insufficient documentation

## 2012-05-03 DIAGNOSIS — M629 Disorder of muscle, unspecified: Secondary | ICD-10-CM | POA: Insufficient documentation

## 2012-05-03 DIAGNOSIS — M79609 Pain in unspecified limb: Secondary | ICD-10-CM | POA: Insufficient documentation

## 2012-05-03 DIAGNOSIS — M7062 Trochanteric bursitis, left hip: Secondary | ICD-10-CM

## 2012-05-03 DIAGNOSIS — M7061 Trochanteric bursitis, right hip: Secondary | ICD-10-CM

## 2012-05-03 DIAGNOSIS — M76899 Other specified enthesopathies of unspecified lower limb, excluding foot: Secondary | ICD-10-CM | POA: Insufficient documentation

## 2012-05-03 DIAGNOSIS — R209 Unspecified disturbances of skin sensation: Secondary | ICD-10-CM | POA: Insufficient documentation

## 2012-05-03 DIAGNOSIS — M79604 Pain in right leg: Secondary | ICD-10-CM

## 2012-05-03 DIAGNOSIS — M2420 Disorder of ligament, unspecified site: Secondary | ICD-10-CM | POA: Insufficient documentation

## 2012-05-03 NOTE — Progress Notes (Signed)
Addended by: Caryl Ada on: 05/03/2012 02:28 PM   Modules accepted: Orders

## 2012-05-03 NOTE — Progress Notes (Addendum)
Subjective:    Patient ID: Katrina Reynolds, female    DOB: 11-14-1976, 36 y.o.   MRN: [id number]  HPI  Patient is a 36 year old single woman who presents with a long history of low back pain. She reports pain has been present in her low back since 1999. However she was at work lifting a patient which increased pain in her low back. This injury occurred about one year ago. She has not had any recent x-rays or MRI. Her primary care doctor's have been managing her conservatively. She reports she was given some home exercise programs which she did independently. She has not seen a formal physical therapist.  The pain is localized to the lumbosacral junction. It radiates laterally to both hips almost along the greater trochanter's bilaterally.  She reports rather constant pain. Pain is worse with activity, walking bending standing all exacerbate pain the most comfortable position she notes is when she is sitting slightly flexed forward.  Coughing or sneezing does not exacerbate pain there.  Her lateral 3 toes on her left foot single when she is oriented on the toilet, in the car prolonged sitting.  Denies any leg weakness per se.  Denies problems with bowel or bladder.   She also reports lateral hip pain which began after starting a new job at [employer]. She notes that with this new job she was required to do quite a bit her walking. That is 9 hours a day up on her feet.   Also notes knee pain at today's focus will be on the hips and low back.     Pain Inventory Average Pain 8 Pain Right Now 9 My pain is sharp, dull, stabbing, tingling and aching  In the last 24 hours, has pain interfered with the following? General activity 8 Relation with others 8 Enjoyment of life 8 What TIME of day is your pain at its worst? night Sleep (in general) Poor  Pain is worse with: walking, bending, sitting, inactivity, standing and some activites Pain improves with: rest, pacing activities and  medication Relief from Meds: 2  Mobility walk without assistance how many minutes can you walk? 10 do you drive?  yes Do you have any goals in this area?  yes  Function employed # of hrs/week 40  Neuro/Psych weakness numbness tingling spasms depression anxiety  Prior Studies x-rays  Physicians involved in your care Any changes since last visit?  no       Review of Systems  Neurological: Positive for weakness and numbness.  Psychiatric/Behavioral: Positive for dysphoric mood.  All other systems reviewed and are negative.       Objective:   Physical Exam  Well-developed well-nourished woman who does not appear in any distress  Oriented x3 speech is clear affect is bright she's alert cooperative and pleasant  Cranial nerves are grossly intact coordination is intact  Reflexes are 2+ biceps triceps brachioradialis, 2+ patellar and Achilles tendon bilaterally  Sensation decreased along the left lateral foot  Vibratory senses intact  5 over 5 strength bilateral lower extremities at hip flexors knee extensors dorsiflexors plantar flexors EHLE inverters and knee flexors  Transitions without difficulty from sit to stand  Gait nonantalgic  Adequate heel toe walking, adequate tandem gait  Limitations in lumbar motion in flexion and extension  Straight leg raise is negative   Bilateral knee is examined, no effusion noted, no joint line tenderness appreciated, no instability noted  Mild crepitus intermittently with flexion extension left knee Significant tenderness  over bilateral trochanters  08/27/10 Clinical Data: Chronic low back pain. Bilateral radiculopathy. No  injury.  LUMBAR SPINE - COMPLETE 4+ VIEW  Comparison: None.  Findings: There are five lumbar type vertebra. There are no  fractures, subluxations, or destructive changes. The interspaces  are normally maintained. Minor anterior osteophytic changes are  noted anteriorly at the T12-L1  level.  IMPRESSION:  Minor anterior osteophytic changes noted within the thoracolumbar  spine at the T12-L1 level. Otherwise, negative study.  Provider: Delma Officer   Clinical Data: Lifting injury two weeks ago with left-sided neck pain.  CERVICAL SPINE-5 VIEW: 06/26/2005 Five views of the cervical spine were obtained. The cervical vertebrae are straightened in alignment. Intervertebral disk spaces are normal and no prevertebral soft tissue swelling is seen. On oblique views, the foramina are patent. The odontoid process is intact.  IMPRESSION:  Straightened alignment. No acute abnormality.  Provider: Milas Hock      Negative pelvic CT.12/17/2006 Negative abdomen CT scan 12/17/2006     Assessment & Plan:  1. Low back pain with recent acute exacerbation with radiation down left leg, intermittent numb  left foot  Will obtain lumbar radiographs.  Anticipate move toward MRI of lumbar spine, may consider epidural steroid injection, also may consider medial branch block lumbar spine.  Physical therapy to address proper body mechanics and posture, pain management techniques, core strengthening program       2. Bilateral trochanteric bursitis with iliotibial band syndrome   Would like to start physical therapy to address trochanteric bursitis  Patient should start icing twice a day to 3 times a day for 20 minutes  May consider injection for trochanter bursitis   F/u in 2 weeks  UDS done 05/04/11 consistant

## 2012-05-03 NOTE — Patient Instructions (Signed)
1. Low back pain with recent acute exacerbation with radiation down left leg, intermittent numb  left foot  Will obtain lumbar radiographs.  Anticipate move toward MRI of lumbar spine, may consider epidural steroid injection, also may consider medial branch block lumbar spine.  Physical therapy to address proper body mechanics and posture, pain management techniques, core strengthening program     2. Bilateral trochanteric bursitis with iliotibial band syndrome   Would like to start physical therapy to address trochanteric bursitis  Patient should start icing twice a day to 3 times a day for 20 minutes  May consider injection for trochanter bursitis

## 2012-05-17 ENCOUNTER — Ambulatory Visit (HOSPITAL_COMMUNITY)
Admission: RE | Admit: 2012-05-17 | Discharge: 2012-05-17 | Disposition: A | Discharge status: Home / Self Care | Payer: Managed Care, Other (non HMO) | Source: Ambulatory Visit | Attending: Physical Medicine and Rehabilitation | Admitting: Physical Medicine and Rehabilitation

## 2012-05-17 ENCOUNTER — Encounter: Payer: Self-pay | Admitting: Physical Medicine and Rehabilitation

## 2012-05-17 ENCOUNTER — Encounter
Payer: Managed Care, Other (non HMO) | Attending: Physical Medicine and Rehabilitation | Admitting: Physical Medicine and Rehabilitation

## 2012-05-17 VITALS — BP 119/73 | HR 104 | Resp 16 | Ht 64.0 in | Wt 155.0 lb

## 2012-05-17 DIAGNOSIS — M549 Dorsalgia, unspecified: Secondary | ICD-10-CM | POA: Insufficient documentation

## 2012-05-17 DIAGNOSIS — M5380 Other specified dorsopathies, site unspecified: Secondary | ICD-10-CM | POA: Insufficient documentation

## 2012-05-17 DIAGNOSIS — M76899 Other specified enthesopathies of unspecified lower limb, excluding foot: Secondary | ICD-10-CM

## 2012-05-17 DIAGNOSIS — M545 Low back pain, unspecified: Secondary | ICD-10-CM | POA: Insufficient documentation

## 2012-05-17 DIAGNOSIS — M7062 Trochanteric bursitis, left hip: Secondary | ICD-10-CM

## 2012-05-17 DIAGNOSIS — M79609 Pain in unspecified limb: Secondary | ICD-10-CM | POA: Insufficient documentation

## 2012-05-17 DIAGNOSIS — M7061 Trochanteric bursitis, right hip: Secondary | ICD-10-CM

## 2012-05-17 DIAGNOSIS — IMO0002 Reserved for concepts with insufficient information to code with codable children: Secondary | ICD-10-CM

## 2012-05-17 MED ORDER — GABAPENTIN 300 MG PO CAPS
300.0000 mg | ORAL_CAPSULE | Freq: Three times a day (TID) | ORAL | Status: DC
Start: 2012-05-17 — End: 2013-03-28

## 2012-05-17 NOTE — Patient Instructions (Signed)
I have ordered low-back x-rays  I have given you a brochure on medial branch blocks  I like to set you up for hip bursitis injections  I'm also considering lumbar MRI

## 2012-05-17 NOTE — Progress Notes (Signed)
Subjective:    Patient ID: Katrina Reynolds, female    DOB: 03-23-1976, 36 y.o.   MRN: [id number]  HPI  Patient is a 36 year old single woman who presents with a long history of low back pain. She reports pain has been present in her low back since 1999. However she was at work lifting a patient which increased pain in her low back. This injury occurred about one year ago. She has not had any recent x-rays or MRI. Her primary care doctor's have been managing her conservatively. She reports she was given some home exercise programs which she did independently. She has not seen a formal physical therapist.  The pain is localized to the lumbosacral junction. It radiates laterally to both hips almost along the greater trochanter's bilaterally.  She reports rather constant pain. Pain is worse with activity, walking bending standing all exacerbate pain the most comfortable position she notes is when she is sitting slightly flexed forward.  Coughing or sneezing does not exacerbate pain there.  Her lateral 3 toes on her left foot tingle when she is seated on the toilet or in the car prolonged sitting.  Denies any leg weakness per se.  Denies problems with bowel or bladder.  She also reports lateral hip pain which began after starting a new job at [employer]. She notes that with this new job she was required to do quite a bit her walking. That is 9 hours a day up on her feet.  Also notes knee pain but last visits focus was on the hips and low back.       Pain Inventory Average Pain 9 Pain Right Now 10 My pain is constant, sharp, stabbing and aching  In the last 24 hours, has pain interfered with the following? General activity 7 Relation with others 7 Enjoyment of life 7 What TIME of day is your pain at its worst? night Sleep (in general) Poor  Pain is worse with: walking, bending, sitting and standing Pain improves with: rest and heat/ice Relief from Meds: 1  Mobility walk without  assistance how many minutes can you walk? 15 ability to climb steps?  yes do you drive?  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  no  Physicians involved in your care Any changes since last visit?  no   Family History  Problem Relation Age of Onset  . Diabetes Mother   . Diabetes Father    History   Social History  . Marital Status: Single    Spouse Name: N/A    Number of Children: N/A  . Years of Education: N/A   Social History Main Topics  . Smoking status: Current Everyday Smoker    Types: Cigarettes  . Smokeless tobacco: Never Used   Comment: 7-8 cigs day  . Alcohol Use: None  . Drug Use: None  . Sexually Active: None   Other Topics Concern  . None   Social History Narrative  . None   Past Surgical History  Procedure Date  . Cesarean section    History reviewed. No pertinent past medical history. Ht 5\' 4"  (1.626 m)  Wt 155 lb (70.308 kg)  BMI 26.61 kg/m2  LMP 04/08/2012   Review of Systems  Musculoskeletal: Positive for back pain.       Spasms  Neurological: Positive for numbness.  All other systems reviewed and are negative.       Objective:   Physical Exam  Well-developed well-nourished woman who does not appear in any distress  Oriented x3 speech is clear affect is bright she's alert cooperative and pleasant  Cranial nerves are grossly intact coordination is intact  Reflexes are 2+ biceps triceps brachioradialis, 2+ patellar and Achilles tendon bilaterally  Sensation decreased along the left lateral foot  Vibratory senses intact  5 over 5 strength bilateral lower extremities at hip flexors knee extensors dorsiflexors plantar flexors EHL inverters and knee flexors  Transitions without difficulty from sit to stand  Gait nonantalgic  Adequate heel toe walking, adequate  tandem gait  Limitations in lumbar motion in flexion and extension on some pain with forward flexion but more pain with extension and very limited extension is noted Straight leg raise is negative  Bilateral knee is examined, no effusion noted, no joint line tenderness appreciated, no instability noted  Mild crepitus intermittently with flexion extension left knee  Significant tenderness over bilateral trochanters  08/27/10  Clinical Data: Chronic low back pain. Bilateral radiculopathy. No  injury.    LUMBAR SPINE - COMPLETE 4+ VIEW  Comparison: None.  Findings: There are five lumbar type vertebra. There are no  fractures, subluxations, or destructive changes. The interspaces  are normally maintained. Minor anterior osteophytic changes are  noted anteriorly at the T12-L1 level.        Assessment & Plan:  Minor anterior osteophytic changes noted within the thoracolumbar  spine at the T12-L1 level. Otherwise, negative study.  Provider: Delma Officer   Clinical Data: Lifting injury two weeks ago with left-sided neck pain.  CERVICAL SPINE-5 VIEW: 06/26/2005  Five views of the cervical spine were obtained. The cervical vertebrae are straightened in alignment. Intervertebral disk spaces are normal and no prevertebral soft tissue swelling is seen. On oblique views, the foramina are patent. The odontoid process is intact.  IMPRESSION:  Straightened alignment. No acute abnormality.  Provider: Milas Hock      Negative pelvic CT.12/17/2006  Negative abdomen CT scan 12/17/2006  Assessment & Plan:   1. Low back pain with recent acute exacerbation with radiation down left leg, intermittent numb left foot  Will obtain lumbar radiographs. May consider medial branch blocks down the road   Pt did not get radiographs as requested.  Start gabapentin 300 mg at night tight rating up to 4 times a day  I will reordered lumbar radiographs and I would still like to obtain lumbar  MRI.    Anticipate move toward MRI of lumbar spine, may consider epidural steroid injection, also may consider medial branch block lumbar spine.  Physical therapy to address proper body mechanics and posture, pain management techniques, core strengthening program    2. Bilateral trochanteric bursitis with iliotibial band syndrome  Would like to start physical therapy to address trochanteric bursitis  Patient should start icing twice a day to 3 times a day for 20 minutes  May consider injection for trochanter bursitis we'll see if I can get her scheduled for an injection next week or later this week     UDS done 05/04/11 consistant

## 2012-05-19 ENCOUNTER — Encounter: Payer: Self-pay | Admitting: Physical Medicine and Rehabilitation

## 2012-05-19 ENCOUNTER — Encounter (HOSPITAL_BASED_OUTPATIENT_CLINIC_OR_DEPARTMENT_OTHER): Payer: Managed Care, Other (non HMO) | Admitting: Physical Medicine and Rehabilitation

## 2012-05-19 VITALS — BP 120/78 | HR 85 | Resp 16 | Ht 64.0 in | Wt 153.0 lb

## 2012-05-19 DIAGNOSIS — M549 Dorsalgia, unspecified: Secondary | ICD-10-CM

## 2012-05-19 DIAGNOSIS — M7061 Trochanteric bursitis, right hip: Secondary | ICD-10-CM

## 2012-05-19 NOTE — Progress Notes (Signed)
Patient: Right hip pain not relieved by medical management and other conservative care  Informed consent was obtained after describing risks and benefits of the procedure with the patient,, this includes bleeding, bruising, infection and medication side effects.  The patient wishes to proceed and has given written consent  The patient was placed in a lateral decubitus position. The lateral aspect of the greater trochanter was marked and prepped with betadine and alcohol. A 21-gauge needle was inserted into the lateral hip (greater trochanter area ).  After negative draw back for blood, solution containing one milligram of 40 mg per milliliter kenalog and 4 milliliters of 1% lidocaine were injected. Patient tolerated the procedure well  post  injections instructions were given.

## 2012-05-19 NOTE — Progress Notes (Addendum)
  Subjective:    Patient ID: Katrina Reynolds, female    DOB: June 04, 1976, 36 y.o.   MRN: [id number]  HPI  The patient was seen on Monday and a set up for right hip greater trochanter bursitis injection  Pain Inventory Average Pain 10 Pain Right Now 9 My pain is sharp, dull and aching  In the last 24 hours, has pain interfered with the following? General activity 8 Relation with others 9 Enjoyment of life 9 What TIME of day is your pain at its worst? all of the time Sleep (in general) Poor  Pain is worse with: walking, sitting and standing Pain improves with: rest, pacing activities and medication Relief from Meds: 3  Mobility walk without assistance  Function employed # of hrs/week 40+  Neuro/Psych tingling trouble walking  Prior Studies Any changes since last visit?  yes x-rays  Physicians involved in your care Any changes since last visit?  no   Family History  Problem Relation Age of Onset  . Diabetes Mother   . Diabetes Father    History   Social History  . Marital Status: Single    Spouse Name: N/A    Number of Children: N/A  . Years of Education: N/A   Social History Main Topics  . Smoking status: Current Everyday Smoker    Types: Cigarettes  . Smokeless tobacco: Never Used   Comment: 7-8 cigs day  . Alcohol Use: None  . Drug Use: None  . Sexually Active: None   Other Topics Concern  . None   Social History Narrative  . None   Past Surgical History  Procedure Date  . Cesarean section    History reviewed. No pertinent past medical history. BP 120/78  Pulse 85  Resp 16  Ht 5\' 4"  (1.626 m)  Wt 153 lb (69.4 kg)  BMI 26.26 kg/m2  SpO2 99%  LMP 05/17/2012   Review of Systems  Musculoskeletal: Positive for gait problem.  All other systems reviewed and are negative.       Objective:   Physical Exam Unchanged from previous exam, tender over bilateral greater trochanter's, worse on the right than on left  Lumbar spine radiographs  reviewed       Assessment & Plan:  1. Low back pain with recent acute exacerbation with radiation down left leg, intermittent numb left foot    Radiographs are reviewed and I will order lumbar MRI to evaluate for HNP.   Pt did not get radiographs as requested.  Start gabapentin 300 mg at night tight rating up to 4 times a day  I will reordered lumbar radiographs and I would still like to obtain lumbar MRI.  Anticipate move toward MRI of lumbar spine, may consider epidural steroid injection, also may consider medial branch block lumbar spine.  Physical therapy to address proper body mechanics and posture, pain management techniques, core strengthening program    2. Bilateral trochanteric bursitis with iliotibial band syndrome  Would like to start physical therapy to address trochanteric bursitis  Patient should start icing twice a day to 3 times a day for 20 minutes   Injection for right trochanter bursitis today, patient tolerated this well pain was an 8 to a 9 on a scale of 10 over the right lateral hip after the injection she reported no pain in this area.

## 2012-05-19 NOTE — Patient Instructions (Signed)
Joint Injection Care After Refer to this sheet in the next few days. These instructions provide you with information on caring for yourself after you have had a joint injection. Your caregiver also may give you more specific instructions. Your treatment has been planned according to current medical practices, but problems sometimes occur. Call your caregiver if you have any problems or questions after your procedure. After any type of joint injection, it is not uncommon to experience:  Soreness, swelling, or bruising around the injection site.   Mild numbness, tingling, or weakness around the injection site caused by the numbing medicine used before or with the injection.  It also is possible to experience the following effects associated with the specific agent after injection:  Iodine-based contrast agents:   Allergic reaction (itching, hives, widespread redness, and swelling beyond the injection site).   Corticosteroids (These effects are rare.):   Allergic reaction.   Increased blood sugar levels (If you have diabetes and you notice that your blood sugar levels have increased, notify your caregiver).   Increased blood pressure levels.   Mood swings.   Hyaluronic acid in the use of viscosupplementation.   Temporary heat or redness.   Temporary rash and itching.   Increased fluid accumulation in the injected joint.  These effects all should resolve within a day after your procedure.  HOME CARE INSTRUCTIONS  Limit yourself to light activity the day of your procedure. Avoid lifting heavy objects, bending, stooping, or twisting.   Take prescription or over-the-counter pain medication as directed by your caregiver.   You may apply ice to your injection site to reduce pain and swelling the day of your procedure. Ice may be applied 3 to 4 times:   Put ice in a plastic bag.   Place a towel between your skin and the bag.   Leave the ice on for no longer than 15 to 20 minutes  each time.  SEEK IMMEDIATE MEDICAL CARE IF:   Pain and swelling get worse rather than better or extend beyond the injection site.   Numbness does not go away.   Blood or fluid continues to leak from the injection site.   You have chest pain.   You have swelling of your face or tongue.   You have trouble breathing or you become dizzy.   You develop a fever, chills, or severe tenderness at the injection site that last longer than 1 day.  MAKE SURE YOU:  Understand these instructions.   Watch your condition.   Get help right away if you are not doing well or if you get worse.  Document Released: 08/14/2011 Document Revised: 11/20/2011 Document Reviewed: 08/14/2011 [facility] Patient Information 2012 [place], [place].    Followup in 2-3 weeks

## 2012-05-19 NOTE — Progress Notes (Signed)
Addended by: Ashok Cordia on: 05/19/2012 02:17 PM   Modules accepted: Orders

## 2012-05-27 ENCOUNTER — Ambulatory Visit
Admission: RE | Admit: 2012-05-27 | Discharge: 2012-05-27 | Disposition: A | Discharge status: Home / Self Care | Payer: Managed Care, Other (non HMO) | Source: Ambulatory Visit | Attending: Physical Medicine and Rehabilitation | Admitting: Physical Medicine and Rehabilitation

## 2012-05-27 DIAGNOSIS — M549 Dorsalgia, unspecified: Secondary | ICD-10-CM

## 2012-06-14 ENCOUNTER — Ambulatory Visit: Payer: Managed Care, Other (non HMO) | Admitting: Physical Medicine and Rehabilitation

## 2012-06-18 ENCOUNTER — Encounter: Payer: Self-pay | Admitting: Physical Medicine and Rehabilitation

## 2012-06-18 ENCOUNTER — Telehealth: Payer: Self-pay | Admitting: Family Medicine

## 2012-06-18 ENCOUNTER — Encounter
Payer: Medicaid Other | Attending: Physical Medicine and Rehabilitation | Admitting: Physical Medicine and Rehabilitation

## 2012-06-18 VITALS — BP 110/72 | HR 98 | Resp 14 | Ht 64.0 in | Wt 151.0 lb

## 2012-06-18 DIAGNOSIS — M7062 Trochanteric bursitis, left hip: Secondary | ICD-10-CM

## 2012-06-18 DIAGNOSIS — M545 Low back pain, unspecified: Secondary | ICD-10-CM | POA: Insufficient documentation

## 2012-06-18 DIAGNOSIS — M7061 Trochanteric bursitis, right hip: Secondary | ICD-10-CM

## 2012-06-18 DIAGNOSIS — M76899 Other specified enthesopathies of unspecified lower limb, excluding foot: Secondary | ICD-10-CM | POA: Insufficient documentation

## 2012-06-18 MED ORDER — NAPROXEN 500 MG PO TABS: 500.0000 mg | ORAL_TABLET | Freq: Two times a day (BID) | ORAL | Status: DC

## 2012-06-18 NOTE — Progress Notes (Signed)
Subjective:    Patient ID: Katrina Reynolds, female    DOB: [birth year], 36 y.o.   MRN: [id number]  HPIPatient is a 36 year old single woman who presents with a long history of low back pain. She reports pain has been present in her low back since 1999. However she was at work lifting a patient which increased pain in her low back. This injury occurred about one year ago. She has not had any recent x-rays or MRI. Her primary care doctor's have been managing her conservatively. She reports she was given some home exercise programs which she did independently. She has not seen a formal physical therapist.  The pain is localized to the lumbosacral junction. It radiates laterally to both hips almost along the greater trochanter's bilaterally.  She reports rather constant pain. Pain is worse with activity, walking bending standing all exacerbate pain the most comfortable position she notes is when she is sitting slightly flexed forward.  Coughing or sneezing does not exacerbate pain there.  Her lateral 3 toes on her left foot tingle when she is seated on the toilet or in the car prolonged sitting.  Denies any leg weakness per se.  Denies problems with bowel or bladder.  She also reports lateral hip pain which began after starting a new job at [employer]. She notes that with this new job she was required to do quite a bit her walking. That is 9 hours a day up on her feet.  Also notes knee pain but last visits focus was on the hips and low back.   Back pain is about a 5 on scale of 10  Bilateral hip pain which radiates towards the knees laterally is about a 9 on a scale of 10.        Pain Inventory Average Pain 9 Pain Right Now 10 My pain is stabbing and aching  In the last 24 hours, has pain interfered with the following? General activity 10 Relation with others 10 Enjoyment of life 10 What TIME of day is your pain at its worst? morning and night Sleep (in general) Poor  Pain is worse  with: walking, bending, sitting and standing Pain improves with: rest Relief from Meds: 0  Mobility how many minutes can you walk? 10 ability to climb steps?  yes do you drive?  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  no   Family History  Problem Relation Age of Onset  . Diabetes Mother   . Diabetes Father    History   Social History  . Marital Status: Single    Spouse Name: N/A    Number of Children: N/A  . Years of Education: N/A   Social History Main Topics  . Smoking status: Current Everyday Smoker    Types: Cigarettes  . Smokeless tobacco: Never Used   Comment: 7-8 cigs day  . Alcohol Use: None  . Drug Use: None  . Sexually Active: None   Other Topics Concern  . None   Social History Narrative  . None   Past Surgical History  Procedure Date  . Cesarean section    History reviewed. No pertinent past medical history. BP 110/72  Pulse 98  Resp 14  Ht 5\' 4"  (1.626 m)  Wt 151 lb (68.493 kg)  BMI  25.92 kg/m2  SpO2 98%  LMP 06/03/2012     Review of Systems  Constitutional: Positive for diaphoresis.  Gastrointestinal: Positive for diarrhea.  Musculoskeletal: Positive for back pain and arthralgias.  Neurological: Positive for numbness.  Psychiatric/Behavioral: Positive for dysphoric mood.  All other systems reviewed and are negative.       Objective:   Physical Exam Well-developed well-nourished woman who does not appear in any distress  Oriented x3 speech is clear affect is bright she's alert cooperative and pleasant  Cranial nerves are grossly intact coordination is intact  Reflexes are 2+ biceps triceps brachioradialis, 2+ patellar and Achilles tendon bilaterally  Sensation decreased along the left lateral  foot  Vibratory senses intact  5 over 5 strength bilateral lower extremities at hip flexors knee extensors dorsiflexors plantar flexors EHL inverters and knee flexors  Transitions without difficulty from sit to stand  Gait nonantalgic  Adequate heel toe walking, adequate tandem gait  Limitations in lumbar motion in flexion and extension on some pain with forward flexion but more pain with extension and very limited extension is noted  Straight leg raise is negative  Bilateral knee is examined, no effusion noted, no joint line tenderness appreciated, no instability noted  Mild crepitus intermittently with flexion extension left knee  Significant tenderness over bilateral trochanters in down both iliotibial bands. 08/27/10  Clinical Data: Chronic low back pain. Bilateral radiculopathy. No  injury.  LUMBAR SPINE - COMPLETE 4+ VIEW  Comparison: None.  Findings: There are five lumbar type vertebra. There are no  fractures, subluxations, or destructive changes. The interspaces  are normally maintained. Minor anterior osteophytic changes are  noted anteriorly at the T12-L1 level.   RADIOLOGY REPORT*  Clinical Data: Low back pain, bilateral hip pain. Bilateral leg  pain. Some numbness left foot.  MRI LUMBAR SPINE WITHOUT CONTRAST  Technique: Multiplanar and multiecho pulse sequences of the lumbar  spine were obtained without intravenous contrast.  Comparison: 05/17/2012 plain film exam. 12/17/2006 CT abdomen and  pelvis  Findings: Last fully open disc space is labeled L5-S1. Present  examination incorporates from T11-12 disc space through the mid to  lower sacrum. Rudimentary disc at the S1-S2 level.  Conus lower L1 level.  T11-12: Minimal Schmorl's node deformity.  T12-L1: Negative.  L1-2: Minimal left-sided facet joint degenerative changes.  L2-3: Negative.  L3-4: Minimal right facet joint degenerative changes.  L4-5: Minimal facet joint degenerative changes.  L5-S1: Negative.    IMPRESSION:  Minimal scattered facet joint degenerative changes without evidence  of disc herniation, spinal stenosis, foraminal narrowing or nerve  root compression.  Original Report Authenticated By: Fuller Canada, M.D.           Assessment & Plan:    1. Bilateral trochanter bursitis/iliotibial band syndrome  2. Low back pain MRI shows Mild facet involvement, lumbar spine  We'll start patient on Naprosyn 500 mg twice a day. Risks and benefits are reviewed with her. She will take a 2 week course than discontinue. She will let me know if there any problems with this medication for her  MRI of lumbar spine is reviewed with the patient today.  We'll start patient in physical therapy. Will have them address pain management techniques for low back pain. Main focus however will be on her trochanter bursitis. She will need a home program to help her lower extremity flexibility/strength/endurance.  Her current job requires a lot more walking than her previous job.  I also would like her to ice her  lateral hips daily and especially after therapy.  She currently seems to be comfortable with treatment plans and I will see her back in 6 weeks  I have answered all her questions and she has a copy of the after visit summary       UDS done 05/04/11 consistant

## 2012-06-18 NOTE — Patient Instructions (Addendum)
I'm starting you on Naprosyn twice a day. Please take this with food. Let me know if you have any difficulty with this medication stop it appeared stomach hurts.  I'm also getting you started in physical therapy.  The therapist should address problems that you're having with your hips and her low back.  I would like them to to also address pain management techniques for your low back and hips. Also education on proper body mechanics and posture. They will also be helping you with improving range of motion, strength, endurance in her lower extremities. I believe you have bilateral trochanter bursitis/iliotibial band friction syndrome, low back pain may be related to mild facet changes.

## 2012-06-18 NOTE — Telephone Encounter (Signed)
Patient is calling for a referral for a pain referral to a place that is closer to her home.

## 2012-06-21 NOTE — Telephone Encounter (Signed)
Is there a pain clinic in or near [place] that takes Medicaid?

## 2012-06-21 NOTE — Telephone Encounter (Signed)
Spoke with patient, informed her that [facility] in [place] also does pain management and that I would fax her information and call her back when I heard back. Patient expressed understanding.

## 2012-07-15 ENCOUNTER — Ambulatory Visit: Payer: Medicaid Other

## 2012-07-26 NOTE — Telephone Encounter (Signed)
Patient has been scheduled with Dr. Gerilyn Pilgrim for 08/11/2012 @ 4:00pm.  Ileana Ladd

## 2012-08-03 NOTE — Telephone Encounter (Signed)
Patient aware.

## 2012-08-30 ENCOUNTER — Ambulatory Visit (HOSPITAL_COMMUNITY)
Admission: RE | Admit: 2012-08-30 | Discharge: 2012-08-30 | Disposition: A | Discharge status: Home / Self Care | Payer: Managed Care, Other (non HMO) | Source: Ambulatory Visit | Attending: Physical Medicine and Rehabilitation | Admitting: Physical Medicine and Rehabilitation

## 2012-08-30 DIAGNOSIS — M25559 Pain in unspecified hip: Secondary | ICD-10-CM | POA: Insufficient documentation

## 2012-08-30 DIAGNOSIS — M25659 Stiffness of unspecified hip, not elsewhere classified: Secondary | ICD-10-CM | POA: Insufficient documentation

## 2012-08-30 NOTE — Evaluation (Addendum)
Physical Therapy Evaluation  Patient Details  Name: Katrina Reynolds MRN: [id number] Date of Birth: 1976-05-14  Today's Date: 08/30/2012 Time: 1650-1730 PT Time Calculation (min): 40 min Charges: 1 eval Visit#: 1  of 8   Re-eval: 09/29/12 Assessment Diagnosis: B hip pain Next MD Visit: Dr. Gerilyn Pilgrim - 09/07/12  Authorization: MEDICAID until October and will have [employer] through her work  Authorization Time Period:    Authorization Visit#:   of     Past Medical History: No past medical history on file. Past Surgical History:  Past Surgical History  Procedure Date  . Cesarean section     Subjective Symptoms/Limitations Symptoms: PMH: unremarkable Pertinent History: Pt is referred to PT secondary to B hip and low back pain.  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  Her c/co is increased pain to bilateral hips and low back pain.  She is currently taking Naproxin and tylenol.  She has had injections into her hip which helped for about 3 days.  She reports she has been given exercises from her MD.  She reports that she has recently lost 20 lbs from working out.  her c/co is are her bones griding together, difficulty keeping up with her friends and family which has lead to increased depression and anxiety.  She reports that she is now on cymbalta which has led her to increased crying and mood, unable to sleep on her side. 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   Other Assessments: + B FABER (R>L)  Sensation/Coordination/Flexibility/Functional  Tests Coordination Gross Motor Movements are Fluid and Coordinated: No Coordination and Movement Description: unable to indepedendently contract TrA musculature.  Requires significant compensations. Flexibility Obers: Positive 90/90: Positive (RLE)  Assessment RLE Strength Right Hip Flexion: 4/5 Right Hip Extension: 4/5 Right Hip ABduction: 4/5 Right Hip ADduction: 4/5 Right Knee Flexion: 5/5 Right Knee Extension: 5/5 LLE Strength Left Hip Flexion: 4/5 Left Hip Extension: 4/5 Left Hip ABduction: 4/5 Left Hip ADduction: 4/5 Left Knee Flexion: 5/5 Left Knee Extension: 5/5 Palpation Palpation: significant increase in pain and tenderness to gluteal, hip, groin and hip flexor region with significant fascial restrctions and trigger points (R>L)  Mobility/Balance  Posture/Postural Control Posture/Postural Control: Postural limitations Postural Limitations: mild slouched lumbar posture   Exercise/Treatments Stretches Active Hamstring Stretch: 1 rep;30 seconds (BEL) Hip Flexor Stretch: 1 rep;30 seconds (BLE) Piriformis Stretch: 1 rep;30 seconds (BLE, Knee to opp shoulder and Fig 4 1 rep ea)  Physical Therapy Assessment and Plan PT Assessment and Plan Clinical Impression Statement: Pt is a 36 year old female referred to PT secondary to B hip and low back pain.  Pt will benefit from skilled therapeutic intervention in order to improve on the following deficits: Decreased coordination;Pain;Impaired flexibility;Increased fascial restricitons;Increased muscle spasms;Decreased strength;Decreased range of motion;Decreased mobility Rehab Potential: Good PT Frequency: Min 2X/week PT Duration: 4 weeks PT Treatment/Interventions: Gait training;Stair training;Functional mobility training;Therapeutic activities;Therapeutic exercise;Balance training;Neuromuscular re-education;Patient/family education;Manual techniques;Modalities PT Plan: Continue with flexibility exercises, start with core  stabilaztion, gait exercises, heel and toe roll in and out, ITB stretch MET for appropriate hip alignment.    Goals Home Exercise Program Pt will Perform  Home Exercise Program: Independently PT Short Term Goals Time to Complete Short Term Goals: 2 weeks PT Short Term Goal 1: Pt will improve her core coordination and demonstrate indepedent TrA, multifidus and PF contraction.  PT Short Term Goal 2: Pt will present with minimal fascial restrctions to gluteal and hip flexor region.  PT Long Term Goals Time to Complete Long Term Goals: 4 weeks PT Long Term Goal 1: Pt will report pain less than 4/10 for 75% of her day for improved QOL.  PT Long Term Goal 2: Pt will present with normalized pelvic alignment.  Long Term Goal 3: Pt will improve LE strength to WNL in order to tolerate standing and walking for greater than 1 hour to continue with job requirments.  Long Term Goal 4: Pt will improve her LE flexibility to WNL and core strength and coordination to WNL in order to maintain appropriate posture with min. cueing for an entire therapy regime.   Problem List Patient Active Problem List  Diagnosis  . ANXIETY  . TOBACCO DEPENDENCE  . CUBITAL TUNNEL SYNDROME  . RHINITIS, ALLERGIC  . HYDRADENITIS  . BACK PAIN, LUMBAR, WITH RADICULOPATHY  . Epidermoid cyst  . Hip pain  . Hip stiffness    PT - End of Session Activity Tolerance: Patient tolerated treatment well PT Plan of Care PT Home Exercise Plan: flexibility, see scanned report PT Patient Instructions: importance of core strength Consulted and Agree with Plan of Care: Patient  Annett Fabian, PT 08/30/2012, 5:50 PM  INITIAL EVALUATION  Physical Therapy     Patient Name: Katrina Reynolds Date Of Birth: 1976/11/16  Guardian Name: N/A Treatment ICD-9 Code: 16109  Address: [address] Date of Evaluation: 08/30/2012  [place], [place] [address] Requested Dates of Service: 08/31/2012 - 09/27/2012       Therapy History: No known therapy for this  problem  Reason For Referral: Recipient has an exacerbation of an old injury, disease or condition  Prior Level of Function: Independent/Modified Independent with all ADLs (OT/PT) or Audition, Communication, Voice and/or Swallowing Skills (ST/AUD)  Additional Medical History: Pt is referred to PT secondary to B hip and low back pain. 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 Her c/co is increased pain to bilateral hips and low back pain. She is currently taking Naproxin and tylenol. She has had injections into her hip which helped for about 3 days. She reports she has been given exercises from her MD. She reports that she has recently lost 20 lbs from working out. her c/co is are her bones griding together, difficulty keeping up with her friends and family which has lead to increased depression and anxiety. She reports that she is now on cymbalta which has led her to increased crying and mood, unable to sleep on her side.   Prematurity: N/A  Severity Level: N/A       Treatment Goals:  1. Goal: Pt will report pain less than 4/10 for 75% of her day for improved QOL.  Baseline: Pain Assessment Currently in Pain?: Yes Pain Score: 8 Pain Location: Hip Pain Orientation: Right;Left (R >L) Pain Type: Chronic pain  Duration: 4 Week(s)  2. Goal: Pt will present with normalized pelvic alignment.  Baseline: significant decreae in R hip pelvic rotation.  Duration: 4 Week(s)  3. Goal: Pt will improve her core coordination and demonstrate indepedent TrA, multifidus and PF contraction.  Baseline: unable to indepedendently contract TrA musculature. Requires significant  compensations.  Duration: 2 Week(s)  4. Goal: Pt will be independent with and HEP  Baseline: education only  Duration: 2 Week(s)  5. Goal: Pt will improve LE strength to WNL in order to tolerate standing and walking for greater than 1 hour to continue with job requirments.  Baseline: RLE Strength Right Hip  Flexion: 4/5 Right Hip Extension: 4/5 Right Hip ABduction: 4/5 Right Hip ADduction: 4/5 Right Knee Flexion: 5/5 Right Knee Extension: 5/5 LLE Strength Left Hip Flexion: 4/5 Left Hip Extension: 4/5 Left Hip ABduction: 4/5 Left Hip ADduction: 4/5 Left Knee Flexion: 5/5 Left Knee Extension: 5/5  Duration: 4 Week(s)  6. Goal: Pt will present with minimal fascial restrctions to gluteal and hip flexor region.  Baseline: significant increase in pain and tenderness to gluteal, hip, groin and hip flexor region with significant fascial restrctions and trigger points (R >L)  Duration: 2 Week(s)  Goal: Pt will improve her LE flexibility to WNL and core strength and coordination to WNL in order to maintain appropriate posture with min. cueing for an entire therapy regime.  Baseline: mild slouched lumbar posture Obers: Positive 90/90: Positive (RLE) FABER's: Positive (BLE)  Duration: 4 Week(s)         Treatment Frequency/Duration:  2x/week for 4 weeks  Units per visit: N/A    Additional Information: Pt is a 36 year old female referred to PT secondary to B hip and low back pain. Pt will benefit from skilled therapeutic intervention in order to improve on the following deficits: Decreased coordination;Pain;Impaired flexibility;Increased fascial restricitons;Increased muscle spasms;Decreased strength;Decreased range of motion;Decreased mobility PT Treatment/Interventions: Gait training;Stair training;Functional mobility training;Therapeutic activities;Therapeutic exercise;Balance training;Neuromuscular re-education;Patient/family education;Manual techniques;Modalities   Annett Fabian, PT  08/31/12      Therapist Signature  Date Physician Signature  Date    Annett Fabian       Therapist Name  Physician Name   Refer to the Review Status page for current case status

## 2012-09-30 ENCOUNTER — Ambulatory Visit (HOSPITAL_COMMUNITY)
Admission: RE | Admit: 2012-09-30 | Payer: Managed Care, Other (non HMO) | Source: Ambulatory Visit | Attending: Pediatrics | Admitting: Pediatrics

## 2012-09-30 ENCOUNTER — Ambulatory Visit (HOSPITAL_COMMUNITY)
Admission: RE | Admit: 2012-09-30 | Discharge: 2012-09-30 | Disposition: A | Discharge status: Home / Self Care | Payer: Managed Care, Other (non HMO) | Source: Ambulatory Visit | Attending: Physical Medicine and Rehabilitation | Admitting: Physical Medicine and Rehabilitation

## 2012-09-30 DIAGNOSIS — M25559 Pain in unspecified hip: Secondary | ICD-10-CM | POA: Insufficient documentation

## 2012-09-30 DIAGNOSIS — IMO0001 Reserved for inherently not codable concepts without codable children: Secondary | ICD-10-CM | POA: Insufficient documentation

## 2012-09-30 DIAGNOSIS — R262 Difficulty in walking, not elsewhere classified: Secondary | ICD-10-CM | POA: Insufficient documentation

## 2012-09-30 DIAGNOSIS — M25659 Stiffness of unspecified hip, not elsewhere classified: Secondary | ICD-10-CM | POA: Insufficient documentation

## 2012-09-30 NOTE — Progress Notes (Signed)
Physical Therapy Treatment Patient Details  Name: Katrina Reynolds MRN: [id number] Date of Birth: 11-Jul-1976  Today's Date: 09/30/2012 Time: 7829-5621 PT Time Calculation (min): 40 min  Visit#: 2  of 8   Re-eval: 09/29/12 Charges: Therex x 33' Manual x 5'  Authorization: MEDICAID until October and will have [employer] through her work    Subjective: Symptoms/Limitations Symptoms: Pt reports HEP compliance. Pain Assessment Currently in Pain?: Yes Pain Score:   9 Pain Location: Hip Pain Orientation: Right;Left   Exercise/Treatments Supine Ab Set: 10 reps;5 seconds Bent Knee Raise: 10 reps;5 seconds Bridge: 10 reps;5 seconds Isometric Hip Flexion: 10 reps;5 seconds Sidelying Hip Abduction: 10 reps Stretches Active Hamstring Stretch: 3 reps;30 seconds Piriformis Stretch: 3 reps;30 seconds  Manual B LE distraction x 5'  Physical Therapy Assessment and Plan PT Assessment and Plan Clinical Impression Statement: Pt completes therex well but has pain with most active hip motions. Pt respond well to B leg distraction. Pt may benefit from mechanical traction. Will consult PT.  PT Plan: Continue to progress per PT POC. Consult PT about mechanical lumber traction.     Problem List Patient Active Problem List  Diagnosis  . ANXIETY  . TOBACCO DEPENDENCE  . CUBITAL TUNNEL SYNDROME  . RHINITIS, ALLERGIC  . HYDRADENITIS  . BACK PAIN, LUMBAR, WITH RADICULOPATHY  . Epidermoid cyst  . Hip pain  . Hip stiffness    PT - End of Session Activity Tolerance: Patient tolerated treatment well General Behavior During Session: [facility] for tasks performed Cognition: [facility] for tasks performed  Seth Bake, PTA 09/30/2012, 5:07 PM

## 2012-10-04 ENCOUNTER — Ambulatory Visit (HOSPITAL_COMMUNITY)
Admission: RE | Admit: 2012-10-04 | Discharge: 2012-10-04 | Disposition: A | Discharge status: Home / Self Care | Payer: Managed Care, Other (non HMO) | Source: Ambulatory Visit | Attending: Neurology | Admitting: Neurology

## 2012-10-05 NOTE — Progress Notes (Signed)
Physical Therapy Treatment Patient Details  Name: Katrina Reynolds MRN: [id number] Date of Birth: 1976-06-06  Today's Date: 10/05/2012 Time: 7829-5621 PT Time Calculation (min): 18 min Charges: 18' manual  Visit#: 3  of 8   Re-eval: 09/29/12   Authorization: MEDICAID until October and will have [employer] through her work  Authorization Time Period:    Authorization Visit#:   of     Subjective: Symptoms/Limitations Symptoms: Pt reports that she is feeling more flexibile overall, but is still limited by her pain to her R hip.  Pain Assessment Currently in Pain?: Yes Pain Score:   7 Pain Location: Hip Pain Orientation: Right Pain Type: Chronic pain  Exercise/Treatments Manual Therapy Manual Therapy: Joint mobilization Joint Mobilization: L S/L position: R grade I-III AP and PA mobs to her iliac crest to improve pelvic mobility with STM after to decrease pain.  x15 minutes.   Physical Therapy Assessment and Plan PT Assessment and Plan Clinical Impression Statement: [name] has attended 3 OP PT visits allowed per her Medicaid insurance to address B hip bursitis with the following findings: she continues to be limited by pain to her R lateral hip which is aggrevated by palpation.  She has improved her overall flexibility to her LE, however strength remains decreased secondary to increased pain with open chain activities.  She would continue to benefit from more consistent PT if insurance allows, however at this time will D/C from PT secondary to no more allowable visits.   PT Plan: D/C.     Goals Pt will Perform Home Exercise Program: Independently: Met PT Short Term Goals: 2 weeks Goal 1: Pt will improve her core coordination and demonstrate indepedent TrA, multifidus and PF contraction. : Progressing toward goal Goal 2: Pt will present with minimal fascial restrctions to gluteal and hip flexor region.: Progressing toward goal PT Long Term Goals: 4 weeks Goal 1: Pt will report pain  less than 4/10 for 75% of her day for improved QOL.: Not met Goal 2: Pt will present with normalized pelvic alignment.: Progressing toward goal Goal 3: Pt will improve LE strength to WNL in order to tolerate standing and walking for greater than 1 hour to continue with job requirments.: Not met Goal 4: Pt will improve her LE flexibility to WNL and core strength and coordination to WNL in order to maintain appropriate posture with min. cueing for an entire therapy regime.: Not met  Problem List Patient Active Problem List  Diagnosis  . ANXIETY  . TOBACCO DEPENDENCE  . CUBITAL TUNNEL SYNDROME  . RHINITIS, ALLERGIC  . HYDRADENITIS  . BACK PAIN, LUMBAR, WITH RADICULOPATHY  . Epidermoid cyst  . Hip pain  . Hip stiffness    PT - End of Session Activity Tolerance: Patient tolerated treatment well General Behavior During Session: [facility] for tasks performed Cognition: [facility] for tasks performed  Rachael Ferrie, PT 10/05/2012, 8:22 AM

## 2013-03-10 ENCOUNTER — Encounter: Payer: Self-pay | Admitting: Family Medicine

## 2013-03-10 ENCOUNTER — Ambulatory Visit (INDEPENDENT_AMBULATORY_CARE_PROVIDER_SITE_OTHER): Payer: Medicaid Other | Admitting: Family Medicine

## 2013-03-10 VITALS — BP 113/76 | HR 88 | Temp 98.1°F | Ht 64.0 in | Wt 163.0 lb

## 2013-03-10 DIAGNOSIS — K921 Melena: Secondary | ICD-10-CM

## 2013-03-10 DIAGNOSIS — G5600 Carpal tunnel syndrome, unspecified upper limb: Secondary | ICD-10-CM

## 2013-03-10 DIAGNOSIS — G5603 Carpal tunnel syndrome, bilateral upper limbs: Secondary | ICD-10-CM

## 2013-03-10 DIAGNOSIS — M25552 Pain in left hip: Secondary | ICD-10-CM

## 2013-03-10 DIAGNOSIS — M25559 Pain in unspecified hip: Secondary | ICD-10-CM

## 2013-03-10 NOTE — Progress Notes (Signed)
  Subjective:    Patient ID: Katrina Reynolds, female    DOB: Dec 13, 1976, 37 y.o.   MRN: [id number]  HPI  Katrina Reynolds comes in with a few complaints:   Blood in stool: this has been going on for about a month, it started with a little bit of blood, but now there is significant blood with each bowel movement.  It is mixed in the blood.  She denies history of hemorrhoids, pain with bowel movements, constipation. Denies dizziness, fatigue, weakness.   Hip pain: She has significant left hip pain.  She was going to Pain management but says they were not helping her anymore and so she stopped going.  However, they told her she has degenerated hips and needed to see an orthopedist.  She says she is interested in seeing them because her pain is so bad.   She also has Carpal tunnel syndrome bilaterally for which she has had injections.  However, her pain is still severe, especially at night and she wakes up with numb hands.   Patient Active Problem List  Diagnosis  . ANXIETY  . TOBACCO DEPENDENCE  . CUBITAL TUNNEL SYNDROME  . RHINITIS, ALLERGIC  . HYDRADENITIS  . BACK PAIN, LUMBAR, WITH RADICULOPATHY  . Epidermoid cyst  . Hip pain  . Hip stiffness   History  Substance Use Topics  . Smoking status: Current Every Day Smoker    Types: Cigarettes  . Smokeless tobacco: Never Used     Comment: 7-8 cigs day  . Alcohol Use: Not on file   Review of Systems See HPI    Objective:   Physical Exam BP 113/76  Pulse 88  Temp(Src) 98.1 F (36.7 C) (Oral)  Ht 5\' 4"  (1.626 m)  Wt 163 lb (73.936 kg)  BMI 27.97 kg/m2 General appearance: alert, cooperative and no distress Rectal: there is a small external hemorrhoid that is not bleeding or thrombosed (pt says this is a skin tag). Anoscopy: The anoscope was inserted with minimal discomfort using sterile gel.  There were no internal hemorrhoids, fissures, polyps or sources of bleeding noted.        Assessment & Plan:

## 2013-03-10 NOTE — Patient Instructions (Signed)
I am referring you to the Gastroenterologist- I did not see anything on your exam today that I think could be the cause of your bleeding.  In the mean time, please avoid advil, aleve, ibuprofen, motrin, and aspirin.    I am also referring you to the Orthopedist to talk about your hip pain.

## 2013-03-11 DIAGNOSIS — K921 Melena: Secondary | ICD-10-CM | POA: Insufficient documentation

## 2013-03-11 NOTE — Assessment & Plan Note (Signed)
Anoscopy performed without cause found- will refer to GI for further evaluation. Advised no NSAIDS/Aspirin in the mean time.

## 2013-03-11 NOTE — Assessment & Plan Note (Signed)
Will refer to orthopedics for evaluation as she has failed Pain Clinic management.

## 2013-03-22 ENCOUNTER — Telehealth: Payer: Self-pay | Admitting: Family Medicine

## 2013-03-22 ENCOUNTER — Encounter: Payer: Self-pay | Admitting: Gastroenterology

## 2013-03-22 NOTE — Telephone Encounter (Signed)
One is in [facility] workqueue [id number].  She can call their office if she wants to schedule and the other one, records were faxed over to Dr. Jeannetta Ellis office [facility] [phone].

## 2013-03-22 NOTE — Telephone Encounter (Signed)
Contacted:  1: [facility], they have the referral and pt can call to schedule.  2: [facility], they never received referral faxed by Jimmy Footman, CMA.  Will reprint /refax as I cant find the original that was faxed on 3.28.14.  3.  Pt informed of progress and appt made for Monday morning. [name], Maryjo Rochester

## 2013-03-22 NOTE — Telephone Encounter (Signed)
Have you heard anything from these referrals, or do I need to call them? Brietta Manso, Maryjo Rochester

## 2013-03-22 NOTE — Telephone Encounter (Signed)
Patient is calling to check on the status of the Ortho and GI Referrals.

## 2013-03-28 ENCOUNTER — Ambulatory Visit (INDEPENDENT_AMBULATORY_CARE_PROVIDER_SITE_OTHER): Payer: Medicaid Other | Admitting: Family Medicine

## 2013-03-28 VITALS — BP 129/89 | HR 82 | Ht 64.0 in | Wt 162.0 lb

## 2013-03-28 DIAGNOSIS — G5620 Lesion of ulnar nerve, unspecified upper limb: Secondary | ICD-10-CM

## 2013-03-28 DIAGNOSIS — IMO0002 Reserved for concepts with insufficient information to code with codable children: Secondary | ICD-10-CM

## 2013-03-28 DIAGNOSIS — M25559 Pain in unspecified hip: Secondary | ICD-10-CM

## 2013-03-28 DIAGNOSIS — M25552 Pain in left hip: Secondary | ICD-10-CM

## 2013-03-28 MED ORDER — GABAPENTIN 300 MG PO CAPS: 300.0000 mg | ORAL_CAPSULE | Freq: Three times a day (TID) | ORAL | Status: DC

## 2013-03-28 MED ORDER — TRAMADOL HCL 50 MG PO TABS: 50.0000 mg | ORAL_TABLET | Freq: Four times a day (QID) | ORAL | Status: DC | PRN

## 2013-03-28 NOTE — Assessment & Plan Note (Signed)
Referral in place to see ortho- again advised scheduled tylenol and tramadol PRN.

## 2013-03-28 NOTE — Patient Instructions (Signed)
I am sorry you are hurting so badly.  Please start taking tylenol, two extra strength tablets (two 500 mg tablets) three times a day.  Take the tramadol as needed for pain. Continue the gabapentin.   You are due for a pap and a well woman exam- please schedule this appointment at your convenience.

## 2013-03-28 NOTE — Assessment & Plan Note (Signed)
Refill gabapentin for nerve pain.

## 2013-03-28 NOTE — Assessment & Plan Note (Signed)
Referral in place to see Ortho for hip and back pain.  Discussed that I do not feel narcotics are appropriate for chronic management.  Advised scheduled tylenol 1000 mg po TID, and tramadol prn.  She is agreeable to this.

## 2013-03-28 NOTE — Progress Notes (Signed)
  Subjective:    Patient ID: Katrina Reynolds, female    DOB: 04-11-1976, 37 y.o.   MRN: [id number]  HPI:  Katrina Reynolds comes in for follow up of her hip, back, and wrist (carpel tunnel) pain.  She says she was seeing neurology who did nerve conduction studies and were prescribing gabapentin, vicodin, naproxen, and tramadol. She has stopped taking the naproxen due to blood in stool that I recommended last visit.  She has pain in multiple sites and feels tired a lot.    Patient Active Problem List  Diagnosis  . ANXIETY  . TOBACCO DEPENDENCE  . CUBITAL TUNNEL SYNDROME  . RHINITIS, ALLERGIC  . HYDRADENITIS  . BACK PAIN, LUMBAR, WITH RADICULOPATHY  . Epidermoid cyst  . Hip pain  . Hip stiffness  . Blood in stool     History  Substance Use Topics  . Smoking status: Current Every Day Smoker    Types: Cigarettes  . Smokeless tobacco: Never Used     Comment: 7-8 cigs day  . Alcohol Use: Not on file    Family History  Problem Relation Age of Onset  . Diabetes Mother   . Diabetes Father    ROS Pertinent items in HPI    Objective:  Physical Exam:  BP 129/89  Pulse 82  Ht 5\' 4"  (1.626 m)  Wt 162 lb (73.483 kg)  BMI 27.79 kg/m2 General appearance: alert, cooperative and no distress Head: Normocephalic, without obvious abnormality, atraumatic Lungs: clear to auscultation bilaterally Heart: regular rate and rhythm, S1, S2 normal, no murmur, click, rub or gallop Pulses: 2+ and symmetric       Assessment & Plan:

## 2013-04-13 ENCOUNTER — Ambulatory Visit (INDEPENDENT_AMBULATORY_CARE_PROVIDER_SITE_OTHER): Payer: Medicaid Other | Admitting: Gastroenterology

## 2013-04-13 ENCOUNTER — Encounter: Payer: Self-pay | Admitting: Gastroenterology

## 2013-04-13 VITALS — BP 110/66 | HR 80 | Ht 64.0 in | Wt 161.4 lb

## 2013-04-13 DIAGNOSIS — K921 Melena: Secondary | ICD-10-CM

## 2013-04-13 DIAGNOSIS — R109 Unspecified abdominal pain: Secondary | ICD-10-CM

## 2013-04-13 MED ORDER — PEG-KCL-NACL-NASULF-NA ASC-C 100 G PO SOLR: 1.0000 | Freq: Once | ORAL | Status: DC

## 2013-04-13 NOTE — Patient Instructions (Addendum)
You have been given a separate informational sheet regarding your tobacco use, the importance of quitting and local resources to help you quit.  You have been scheduled for a colonoscopy with propofol. Please follow written instructions given to you at your visit today.  Please pick up your prep kit at the pharmacy within the next 1-3 days. If you use inhalers (even only as needed), please bring them with you on the day of your procedure. Your physician has requested that you go to www.startemmi.com and enter the access code given to you at your visit today. This web site gives a general overview about your procedure. However, you should still follow specific instructions given to you by our office regarding your preparation for the procedure.  You have been scheduled for an pelvic ultrasound at [facility] (1st floor of hospital) on 04/15/13 at 8:00am. Please arrive 15 minutes prior to your appointment for registration.  Should you need to reschedule your appointment, please contact radiology at [phone]. This test typically takes about 30 minutes to perform.  Thank you for choosing me and [facility].  Venita Lick. Pleas Koch., MD., Clementeen Graham

## 2013-04-13 NOTE — Progress Notes (Addendum)
History of Present Illness: This is a 37 year old female who notes small amounts of bright red blood per rectum with almost every bowel movements for the past 2 months. For the several months prior to that she noted intermittent bright red blood per with bowel movements. She was seen by her primary physician and underwent anoscopy which showed external hemorrhoids but no internal lesions. She has mild constipation that is controlled with additional fiber.  She has a long history of pelvic and lower abdominal pain that does not change with meals or bowel movements. During her menstrual cycle she notes increased pelvic pain and back pain as well. Denies weight loss, constipation, diarrhea, change in stool caliber, melena, nausea, vomiting, dysphagia, reflux symptoms, chest pain.  Review of Systems: Pertinent positive and negative review of systems were noted in the above HPI section. All other review of systems were otherwise negative.  Current Medications, Allergies, Past Medical History, Past Surgical History, Family History and Social History were reviewed in [employer] record.  Physical Exam: General: Well developed , well nourished, no acute distress Head: Normocephalic and atraumatic Eyes:  sclerae anicteric, EOMI Ears: Normal auditory acuity Mouth: No deformity or lesions Neck: Supple, no masses or thyromegaly Lungs: Clear throughout to auscultation Heart: Regular rate and rhythm; no murmurs, rubs or bruits Abdomen: Soft, mild lower abdominal suprapubic tenderness without rebound or guarding and non distended. No masses, hepatosplenomegaly or hernias noted. Normal Bowel sounds Rectal: Deferred to colonoscopy, recent anoscopy showed external hemorrhoidal tag no internal lesions. Musculoskeletal: Symmetrical with no gross deformities  Skin: No lesions on visible extremities Pulses:  Normal pulses noted Extremities: No clubbing, cyanosis, edema or deformities  noted Neurological: Alert oriented x 4, grossly nonfocal Cervical Nodes:  No significant cervical adenopathy Inguinal Nodes: No significant inguinal adenopathy Psychological:  Alert and cooperative. Normal mood and affect  Assessment and Recommendations:  1. Hematochezia. Rule out hemorrhoids, proctitis, colorectal neoplasms and other disorders. Schedule colonoscopy. The risks, benefits, and alternatives to colonoscopy with possible biopsy, possible destruction of internal hemorrhoids and possible polypectomy were discussed with the patient and they consent to proceed.   2. Pelvic pain/lower abdominal pain. These symptoms are not likely to be of gastrointestinal in etiology. Rule out GYN etiology. Schedule pelvic ultrasound. Further followup with her primary physician and gynecologist.

## 2013-04-15 ENCOUNTER — Ambulatory Visit (HOSPITAL_COMMUNITY): Payer: Medicaid Other

## 2013-05-17 ENCOUNTER — Encounter: Payer: Medicaid Other | Admitting: Gastroenterology

## 2013-05-21 ENCOUNTER — Other Ambulatory Visit: Payer: Self-pay | Admitting: Family Medicine

## 2013-06-28 ENCOUNTER — Encounter: Payer: Self-pay | Admitting: Gastroenterology

## 2013-06-28 ENCOUNTER — Ambulatory Visit (AMBULATORY_SURGERY_CENTER): Payer: BC Managed Care – PPO | Admitting: Gastroenterology

## 2013-06-28 VITALS — BP 98/67 | HR 68 | Temp 97.6°F | Resp 18 | Ht 64.0 in | Wt 161.0 lb

## 2013-06-28 DIAGNOSIS — D126 Benign neoplasm of colon, unspecified: Secondary | ICD-10-CM

## 2013-06-28 DIAGNOSIS — R109 Unspecified abdominal pain: Secondary | ICD-10-CM

## 2013-06-28 DIAGNOSIS — K921 Melena: Secondary | ICD-10-CM

## 2013-06-28 HISTORY — PX: COLONOSCOPY W/ POLYPECTOMY: SHX1380

## 2013-06-28 MED ORDER — SODIUM CHLORIDE 0.9 % IV SOLN: 500.0000 mL | INTRAVENOUS | Status: DC

## 2013-06-28 NOTE — Progress Notes (Signed)
Lidocaine-40mg IV prior to Propofol InductionPropofol given over incremental dosages 

## 2013-06-28 NOTE — Patient Instructions (Addendum)
YOU HAD AN ENDOSCOPIC PROCEDURE TODAY AT THE [facility]: Refer to the procedure report that was given to you for any specific questions about what was found during the examination.  If the procedure report does not answer your questions, please call your gastroenterologist to clarify.  If you requested that your care partner not be given the details of your procedure findings, then the procedure report has been included in a sealed envelope for you to review at your convenience later.  YOU SHOULD EXPECT: Some feelings of bloating in the abdomen. Passage of more gas than usual.  Walking can help get rid of the air that was put into your GI tract during the procedure and reduce the bloating. If you had a lower endoscopy (such as a colonoscopy or flexible sigmoidoscopy) you may notice spotting of blood in your stool or on the toilet paper. If you underwent a bowel prep for your procedure, then you may not have a normal bowel movement for a few days.  DIET: Your first meal following the procedure should be a light meal and then it is ok to progress to your normal diet.  A half-sandwich or bowl of soup is an example of a good first meal.  Heavy or fried foods are harder to digest and may make you feel nauseous or bloated.  Likewise meals heavy in dairy and vegetables can cause extra gas to form and this can also increase the bloating.  Drink plenty of fluids but you should avoid alcoholic beverages for 24 hours.  ACTIVITY: Your care partner should take you home directly after the procedure.  You should plan to take it easy, moving slowly for the rest of the day.  You can resume normal activity the day after the procedure however you should NOT DRIVE or use heavy machinery for 24 hours (because of the sedation medicines used during the test).    SYMPTOMS TO REPORT IMMEDIATELY: A gastroenterologist can be reached at any hour.  During normal business hours, 8:30 AM to 5:00 PM Monday through Friday,  call [phone].  After hours and on weekends, please call the GI answering service at [phone] who will take a message and have the physician on call contact you.   Following lower endoscopy (colonoscopy or flexible sigmoidoscopy):  Excessive amounts of blood in the stool  Significant tenderness or worsening of abdominal pains  Swelling of the abdomen that is new, acute  Fever of 100F or higher   FOLLOW UP: If any biopsies were taken you will be contacted by phone or by letter within the next 1-3 weeks.  Call your gastroenterologist if you have not heard about the biopsies in 3 weeks.  Our staff will call the home number listed on your records the next business day following your procedure to check on you and address any questions or concerns that you may have at that time regarding the information given to you following your procedure. This is a courtesy call and so if there is no answer at the home number and we have not heard from you through the emergency physician on call, we will assume that you have returned to your regular daily activities without incident.  SIGNATURES/CONFIDENTIALITY: You and/or your care partner have signed paperwork which will be entered into your electronic medical record.  These signatures attest to the fact that that the information above on your After Visit Summary has been reviewed and is understood.  Full responsibility of the confidentiality of   this discharge information lies with you and/or your care-partner.   Resume medications. Information given on polyps,hemorrhoids and high fiber diet with discharge instructions. 

## 2013-06-28 NOTE — Op Note (Signed)
[facility] [address] [place], [address]   COLONOSCOPY PROCEDURE REPORT  PATIENT: Katrina Reynolds  MR#: [id number] BIRTHDATE: 30-Aug-1976 , 37  yrs. old GENDER: Female ENDOSCOPIST: Meryl Dare, MD, [facility] REFERRED BY: Rodman Pickle, MD PROCEDURE DATE:  06/28/2013 PROCEDURE:   Colonoscopy with snare polypectomy ASA CLASS:   Class II INDICATIONS:hematochezia. MEDICATIONS: MAC sedation, administered by CRNA and propofol (Diprivan) 400mg  IV DESCRIPTION OF PROCEDURE:   After the risks benefits and alternatives of the procedure were thoroughly explained, informed consent was obtained.  A digital rectal exam revealed external hemorrhoids.   The LB [id number]  endoscope was introduced through the anus and advanced to the terminal ileum which was intubated for a short distance. No adverse events experienced. The quality of the prep was adequate, using MoviPrep  The instrument was then slowly withdrawn as the colon was fully examined.  COLON FINDINGS: A sessile polyp measuring 5 mm in size was found in the transverse colon.  A polypectomy was performed with a cold snare.  The resection was complete and the polyp tissue was completely retrieved. The distal terminal ileum was normal.  The colon was otherwise normal.  There was no diverticulosis, inflammation, polyps or cancers unless previously stated. Retroflexed views revealed no abnormalities. The time to cecum=2 minutes 30 seconds.  Withdrawal time=12 minutes 30 seconds.  The scope was withdrawn and the procedure completed.  COMPLICATIONS: There were no complications.  ENDOSCOPIC IMPRESSION: 1.   Sessile polyp measuring 5 mm in the transverse colon; polypectomy performed with a cold snare 2.   Small external hemorrhoids 3.    Normal terminal ileum  RECOMMENDATIONS: 1.  Await pathology results 2.  Repeat colonoscopy in 5 years if polyp adenomatous; otherwise at age 1 years   eSigned:  Meryl Dare, MD, [facility] 06/28/2013 2:39 PM

## 2013-06-28 NOTE — Progress Notes (Signed)
Patient did not experience any of the following events: a burn prior to discharge; a fall within the facility; wrong site/side/patient/procedure/implant event; or a hospital transfer or hospital admission upon discharge from the facility. (G8907) Patient did not have preoperative order for IV antibiotic SSI prophylaxis. (G8918)  

## 2013-06-28 NOTE — Progress Notes (Signed)
Called to room to assist during endoscopic procedure.  Patient ID and intended procedure confirmed with present staff. Received instructions for my participation in the procedure from the performing physician.  

## 2013-06-29 ENCOUNTER — Telehealth: Payer: Self-pay | Admitting: *Deleted

## 2013-06-29 NOTE — Telephone Encounter (Signed)
No answer, left message to call if questions or concerns. 

## 2013-07-04 ENCOUNTER — Encounter: Payer: Self-pay | Admitting: Gastroenterology

## 2013-09-07 ENCOUNTER — Other Ambulatory Visit (HOSPITAL_COMMUNITY): Payer: Self-pay | Admitting: Oral Surgery

## 2013-09-07 DIAGNOSIS — M272 Inflammatory conditions of jaws: Secondary | ICD-10-CM

## 2013-09-09 ENCOUNTER — Ambulatory Visit (HOSPITAL_COMMUNITY): Admission: RE | Admit: 2013-09-09 | Payer: BC Managed Care – PPO | Source: Ambulatory Visit

## 2013-09-20 ENCOUNTER — Other Ambulatory Visit (HOSPITAL_COMMUNITY): Payer: Self-pay | Admitting: Oral Surgery

## 2013-09-20 DIAGNOSIS — M869 Osteomyelitis, unspecified: Secondary | ICD-10-CM

## 2013-09-23 ENCOUNTER — Other Ambulatory Visit (HOSPITAL_COMMUNITY): Payer: Self-pay | Admitting: Oral Surgery

## 2013-09-23 ENCOUNTER — Ambulatory Visit (HOSPITAL_COMMUNITY)
Admission: RE | Admit: 2013-09-23 | Discharge: 2013-09-23 | Disposition: A | Discharge status: Home / Self Care | Payer: BC Managed Care – PPO | Source: Ambulatory Visit | Attending: Oral Surgery | Admitting: Oral Surgery

## 2013-09-23 DIAGNOSIS — R51 Headache: Secondary | ICD-10-CM | POA: Insufficient documentation

## 2013-09-23 DIAGNOSIS — S02400A Malar fracture unspecified, initial encounter for closed fracture: Secondary | ICD-10-CM | POA: Insufficient documentation

## 2013-09-23 DIAGNOSIS — M869 Osteomyelitis, unspecified: Secondary | ICD-10-CM

## 2013-09-23 DIAGNOSIS — X58XXXA Exposure to other specified factors, initial encounter: Secondary | ICD-10-CM | POA: Insufficient documentation

## 2013-09-23 DIAGNOSIS — S02401A Maxillary fracture, unspecified, initial encounter for closed fracture: Secondary | ICD-10-CM | POA: Insufficient documentation

## 2013-09-23 MED ORDER — IOHEXOL 300 MG/ML  SOLN
80.0000 mL | Freq: Once | INTRAMUSCULAR | Status: AC | PRN
  Administered 2013-09-23: 80 mL via INTRAVENOUS

## 2014-07-03 ENCOUNTER — Ambulatory Visit
Admission: RE | Admit: 2014-07-03 | Discharge: 2014-07-03 | Disposition: A | Discharge status: Home / Self Care | Payer: BC Managed Care – PPO | Source: Ambulatory Visit | Attending: Nurse Practitioner | Admitting: Nurse Practitioner

## 2014-07-03 ENCOUNTER — Other Ambulatory Visit: Payer: Self-pay | Admitting: Nurse Practitioner

## 2014-07-03 DIAGNOSIS — M545 Low back pain, unspecified: Secondary | ICD-10-CM

## 2014-07-03 DIAGNOSIS — M62830 Muscle spasm of back: Secondary | ICD-10-CM

## 2014-07-06 ENCOUNTER — Other Ambulatory Visit: Payer: BC Managed Care – PPO

## 2014-07-08 ENCOUNTER — Other Ambulatory Visit: Payer: BC Managed Care – PPO

## 2014-07-14 ENCOUNTER — Other Ambulatory Visit: Payer: BC Managed Care – PPO

## 2015-01-02 ENCOUNTER — Telehealth: Payer: Self-pay | Admitting: Gastroenterology

## 2015-01-02 NOTE — Telephone Encounter (Signed)
Left message for patient to call back  

## 2015-01-03 ENCOUNTER — Ambulatory Visit (INDEPENDENT_AMBULATORY_CARE_PROVIDER_SITE_OTHER): Payer: BLUE CROSS/BLUE SHIELD | Admitting: Nurse Practitioner

## 2015-01-03 ENCOUNTER — Encounter: Payer: Self-pay | Admitting: Nurse Practitioner

## 2015-01-03 VITALS — BP 120/80 | HR 78 | Ht 64.0 in | Wt 165.0 lb

## 2015-01-03 DIAGNOSIS — K921 Melena: Secondary | ICD-10-CM

## 2015-01-03 DIAGNOSIS — K629 Disease of anus and rectum, unspecified: Secondary | ICD-10-CM | POA: Insufficient documentation

## 2015-01-03 DIAGNOSIS — K625 Hemorrhage of anus and rectum: Secondary | ICD-10-CM

## 2015-01-03 NOTE — Progress Notes (Addendum)
     History of Present Illness:   Katrina Reynolds is a 39 year old female known to Dr. Fuller Plan. She had a colonoscopy July 2014 for evaluation of hematochezia. Findings included a small polyp (non-adenomatous) disease and small external hemorrhoid..   Patient comes in today with her husband Katrina Reynolds for evaluation of rectal bleeding. She's been having rectal bleeding for 2 months even when expelling gas. No rectal pain. No abdominal pain. Patient is not constipated nor having diarrhea.  Current Medications, Allergies, Past Medical History, Past Surgical History, Family History and Social History were reviewed in [employer] record.  Physical Exam: General: Pleasant, well developed , white female in no acute distress Head: Normocephalic and atraumatic Eyes:  sclerae anicteric, conjunctiva pink  Ears: Normal auditory acuity Lungs: Clear throughout to auscultation Heart: Regular rate and rhythm Abdomen: Soft, non distended, non-tender. No masses, no hepatomegaly. Normal bowel sounds Rectal: In left lateral position at approx 2 o'clock there is perianal flesh colored lesion with an unusually wide irregular base. Lesion originates several millimeters away from the anus. On DRE there was some irregular tissue felt along anterior wall (12 o'clock). Anoscopy done, blood seen in vault without obvious source.   Musculoskeletal: Symmetrical with no gross deformities  Extremities: No edema  Neurological: Alert oriented x 4, grossly nonfocal Psychological:  Alert and cooperative. Normal mood and affect  Assessment and Recommendations:  33. 39 year old female with two month history of painless rectal bleeding (see exam findings). For further evaluation patient will be scheduled for flexible sigmoidoscopy.   2. Perianal lesion. Condyloma?  Possibly a hemorrhoid but some unusual characteristics as described in exam. Will refer to a colorectal surgeon for evaluation. Appointment was made with  Dr. Marcello Moores at [facility] surgery     Addendum 01/23/15:  Obtained consult note from Dr. Marcello Moores at [facility]. Patient had an endoscopy by Dr. Marcello Moores with findings of an external left anterior lesion consistent with anal condyloma. Patient to proceed with laser ablation

## 2015-01-03 NOTE — Patient Instructions (Signed)
You have been scheduled for a flexible sigmoidoscopy. Please follow the written instructions given to you at your visit today. If you use inhalers (even only as needed), please bring them with you on the day of your procedure. You have been scheduled to see Dr Marcello Moores on 01/16/15 at 10:50 am. CC:  [name] MD

## 2015-01-03 NOTE — Telephone Encounter (Signed)
Patient with rectal bleeding for the last several days, having bleeding with passing gas.  History with Dr. Fuller Plan.  She will come in today and see Tye Savoy RNP today

## 2015-01-04 NOTE — Progress Notes (Signed)
Reviewed and agree with management plan.  Marinda Tyer T. Ariadna Setter, MD FACG 

## 2015-01-08 ENCOUNTER — Ambulatory Visit (AMBULATORY_SURGERY_CENTER): Payer: BLUE CROSS/BLUE SHIELD | Admitting: Gastroenterology

## 2015-01-08 ENCOUNTER — Encounter: Payer: Self-pay | Admitting: Gastroenterology

## 2015-01-08 VITALS — BP 96/68 | HR 72 | Temp 99.0°F | Resp 24 | Ht 64.0 in | Wt 165.0 lb

## 2015-01-08 DIAGNOSIS — K635 Polyp of colon: Secondary | ICD-10-CM | POA: Diagnosis not present

## 2015-01-08 DIAGNOSIS — D125 Benign neoplasm of sigmoid colon: Secondary | ICD-10-CM

## 2015-01-08 DIAGNOSIS — K921 Melena: Secondary | ICD-10-CM | POA: Diagnosis present

## 2015-01-08 DIAGNOSIS — R6889 Other general symptoms and signs: Secondary | ICD-10-CM | POA: Diagnosis not present

## 2015-01-08 DIAGNOSIS — K529 Noninfective gastroenteritis and colitis, unspecified: Secondary | ICD-10-CM

## 2015-01-08 HISTORY — PX: SIGMOIDOSCOPY: SUR1295

## 2015-01-08 MED ORDER — MESALAMINE 1000 MG RE SUPP: RECTAL | Status: DC

## 2015-01-08 MED ORDER — SODIUM CHLORIDE 0.9 % IV SOLN: 500.0000 mL | INTRAVENOUS | Status: DC

## 2015-01-08 NOTE — Progress Notes (Signed)
Report to PACU, RN, vss, BBS= Clear.  

## 2015-01-08 NOTE — Patient Instructions (Signed)
Discharge instructions given. Handout on polyps. Prescription sent to pharmacy. Resume previous medications. Return visit in 6 weeks office will call. YOU HAD AN ENDOSCOPIC PROCEDURE TODAY AT [facility]: Refer to the procedure report that was given to you for any specific questions about what was found during the examination.  If the procedure report does not answer your questions, please call your gastroenterologist to clarify.  If you requested that your care partner not be given the details of your procedure findings, then the procedure report has been included in a sealed envelope for you to review at your convenience later.  YOU SHOULD EXPECT: Some feelings of bloating in the abdomen. Passage of more gas than usual.  Walking can help get rid of the air that was put into your GI tract during the procedure and reduce the bloating. If you had a lower endoscopy (such as a colonoscopy or flexible sigmoidoscopy) you may notice spotting of blood in your stool or on the toilet paper. If you underwent a bowel prep for your procedure, then you may not have a normal bowel movement for a few days.  DIET: Your first meal following the procedure should be a light meal and then it is ok to progress to your normal diet.  A half-sandwich or bowl of soup is an example of a good first meal.  Heavy or fried foods are harder to digest and may make you feel nauseous or bloated.  Likewise meals heavy in dairy and vegetables can cause extra gas to form and this can also increase the bloating.  Drink plenty of fluids but you should avoid alcoholic beverages for 24 hours.  ACTIVITY: Your care partner should take you home directly after the procedure.  You should plan to take it easy, moving slowly for the rest of the day.  You can resume normal activity the day after the procedure however you should NOT DRIVE or use heavy machinery for 24 hours (because of the sedation medicines used during the test).     SYMPTOMS TO REPORT IMMEDIATELY: A gastroenterologist can be reached at any hour.  During normal business hours, 8:30 AM to 5:00 PM Monday through Friday, call [phone].  After hours and on weekends, please call the GI answering service at [phone] who will take a message and have the physician on call contact you.   Following lower endoscopy (colonoscopy or flexible sigmoidoscopy):  Excessive amounts of blood in the stool  Significant tenderness or worsening of abdominal pains  Swelling of the abdomen that is new, acute  Fever of 100F or higher FOLLOW UP: If any biopsies were taken you will be contacted by phone or by letter within the next 1-3 weeks.  Call your gastroenterologist if you have not heard about the biopsies in 3 weeks.  Our staff will call the home number listed on your records the next business day following your procedure to check on you and address any questions or concerns that you may have at that time regarding the information given to you following your procedure. This is a courtesy call and so if there is no answer at the home number and we have not heard from you through the emergency physician on call, we will assume that you have returned to your regular daily activities without incident.  SIGNATURES/CONFIDENTIALITY: You and/or your care partner have signed paperwork which will be entered into your electronic medical record.  These signatures attest to the fact that that the information  above on your After Visit Summary has been reviewed and is understood.  Full responsibility of the confidentiality of this discharge information lies with you and/or your care-partner. 

## 2015-01-08 NOTE — Progress Notes (Signed)
Called to room to assist during endoscopic procedure.  Patient ID and intended procedure confirmed with present staff. Received instructions for my participation in the procedure from the performing physician.  

## 2015-01-08 NOTE — Op Note (Signed)
[place]  [employer] [address] [place], [id number]   FLEXIBLE SIGMOIDOSCOPY PROCEDURE REPORT  PATIENT: Katrina Reynolds, Katrina Reynolds  MR#: #[id number] BIRTHDATE: 1976-11-15 , 38  yrs. old GENDER: female ENDOSCOPIST: Ladene Artist, MD, [facility] REFERRED BY: [name] PROCEDURE DATE:  01/08/2015 PROCEDURE:   Sigmoidoscopy with biopsy and Sigmoidoscopy with snare  ASA CLASS:   Class II INDICATIONS: hematochezia.   abnormal DRE. MEDICATIONS: Monitored anesthesia care and Propofol 200 mg IV DESCRIPTION OF PROCEDURE:   After the risks benefits and alternatives of the procedure were thoroughly explained, informed consent was obtained.  Digital exam revealed a skin lesion/tag. The LB PCF-Q180 [id number]  endoscope was introduced through the anus  and advanced to the descending colon , The exam was Without limitations.    The quality of the prep was The overall prep quality was good. .  The instrument was then slowly withdrawn as the mucosa was fully examined.    COLON FINDINGS: Three sessile polyps measuring 4-5 mm in size were found in the sigmoid colon.  Polypectomies were performed with a cold snare.  The resection was complete, the polyp tissue was completely retrieved and sent to histology.  Inflammation, circumferential across the area examined, was found secondary to proctitis in the distal rectum.  Mild granularity, mild edema and some loss of vascular pattern.  Multiple biopsies of the area were performed.   Retroflexed views revealed procitis as described.  The scope was then withdrawn from the patient and the procedure terminated.  COMPLICATIONS: There were no immediate complications.  ENDOSCOPIC IMPRESSION: 1.   Three sessile polyps  in the sigmoid colon; polypectomies performed with a cold snare 2.   Mild proctitis in the distal rectum; multiple biopsies performed 3.   Perianal skin lesion  RECOMMENDATIONS: 1.  Await pathology results 2.  Surgical referral to further  evaluate perianal lesion 3.  Canasa 1000 mg supp pr bid for 2 weeks and then qd for 4 weeks 4.  REV in 6 week  eSigned:  Ladene Artist, MD, [facility] 01/08/2015 11:31 AM

## 2015-01-09 ENCOUNTER — Telehealth: Payer: Self-pay | Admitting: *Deleted

## 2015-01-09 ENCOUNTER — Telehealth: Payer: Self-pay

## 2015-01-09 NOTE — Telephone Encounter (Signed)
Per procedure report patient is scheduled to see Dr. Marcello Moores at [facility] for 01/16/15 11:20.  She is notified of the appt date and time and to arrive at 10:50

## 2015-01-09 NOTE — Telephone Encounter (Signed)
  Follow up Call-  Call back number 01/08/2015 06/28/2013  Post procedure Call Back phone  # [phone]  Permission to leave phone message Yes Yes     Patient questions:  Do you have a fever, pain , or abdominal swelling? No. Pain Score  0 *  Have you tolerated food without any problems? Yes.    Have you been able to return to your normal activities? Yes.    Do you have any questions about your discharge instructions: Diet   No. Medications  No. Follow up visit  No.  Do you have questions or concerns about your Care? No.  Actions: * If pain score is 4 or above: No action needed, pain <4.

## 2015-01-15 ENCOUNTER — Encounter: Payer: Self-pay | Admitting: Gastroenterology

## 2015-01-16 ENCOUNTER — Other Ambulatory Visit (INDEPENDENT_AMBULATORY_CARE_PROVIDER_SITE_OTHER): Payer: Self-pay | Admitting: General Surgery

## 2015-01-16 NOTE — H&P (Signed)
Katrina Reynolds 01/16/2015 11:12 AM Location: [facility] Patient #: [id number] DOB: 12/24/1975 Married / Language: Cleophus Molt / Race: White Female History of Present Illness [name] MD; 06/16/5328 11:40 AM) Patient words: eval bleeding lesion.  The patient is a 39 year old female who presents with anal lesions. Patient presents with anal lesion and rectal bleeding. Lesion has been present for ~37yrs but is growing and becoming more irritated. She is s/p flex sig, which showed proctitis, and she is using Canasa suppositories for this. She has not noticed much results yet but has only been using for 4 days. She states that she has a history of constipation and strains on a daily basis for bowel movements. Other Problems Flossie Buffy, RN; 01/16/2015 11:12 AM) Back Pain Hemorrhoids Migraine Headache  Past Surgical History Flossie Buffy, RN; 01/16/2015 11:12 AM) Colon Polyp Removal - Colonoscopy Oral Surgery Resection of Small Bowel  Diagnostic Studies History Flossie Buffy, RN; 01/16/2015 11:12 AM) Colonoscopy within last year Mammogram >3 years ago Pap Smear 1-5 years ago  Allergies Flossie Buffy, RN; 01/16/2015 11:21 AM) No Known Drug Allergies 01/16/2015  Medication History Flossie Buffy, RN; 01/16/2015 11:21 AM) Hydrocodone-Acetaminophen (7.5-325MG  Tablet, Oral as needed) Active. Canasa (1000MG  Suppository, Rectal as needed) Active. Flonase Allergy Relief (50MCG/ACT Suspension, Nasal as needed) Active.  Social History Flossie Buffy, RN; 01/16/2015 11:12 AM) Alcohol use Occasional alcohol use. Caffeine use Carbonated beverages, Coffee, Tea. No drug use Tobacco use Current every day smoker.  Family History Flossie Buffy, RN; 01/16/2015 11:12 AM) Arthritis Father, Mother. Depression Father, Mother. Diabetes Mellitus Father, Mother. Heart disease in female family member before age 81 Heart disease in female family member before age  40 Hypertension Father, Mother. Kidney Disease Father. Migraine Headache Mother.  Pregnancy / Birth History Flossie Buffy, RN; 01/16/2015 11:12 AM) Age at menarche 29 years. Gravida 3 Maternal age 71-20 Para 3 Regular periods     Review of Systems Delilah Shan Moffitt RN; 01/16/2015 11:12 AM) General Not Present- Appetite Loss, Chills, Fatigue, Fever, Night Sweats, Weight Gain and Weight Loss. Skin Present- Change in Wart/Mole. Not Present- Dryness, Hives, Jaundice, New Lesions, Non-Healing Wounds, Rash and Ulcer. HEENT Present- Seasonal Allergies and Wears glasses/contact lenses. Not Present- Earache, Hearing Loss, Hoarseness, Nose Bleed, Oral Ulcers, Ringing in the Ears, Sinus Pain, Sore Throat, Visual Disturbances and Yellow Eyes. Respiratory Not Present- Bloody sputum, Chronic Cough, Difficulty Breathing, Snoring and Wheezing. Cardiovascular Not Present- Chest Pain, Difficulty Breathing Lying Down, Leg Cramps, Palpitations, Rapid Heart Rate, Shortness of Breath and Swelling of Extremities. Gastrointestinal Present- Bloody Stool and Hemorrhoids. Not Present- Abdominal Pain, Bloating, Change in Bowel Habits, Chronic diarrhea, Constipation, Difficulty Swallowing, Excessive gas, Gets full quickly at meals, Indigestion, Nausea, Rectal Pain and Vomiting. Female Genitourinary Not Present- Frequency, Nocturia, Painful Urination, Pelvic Pain and Urgency. Musculoskeletal Present- Back Pain, Joint Pain, Joint Stiffness, Muscle Pain, Muscle Weakness and Swelling of Extremities. Neurological Present- Numbness. Not Present- Decreased Memory, Fainting, Headaches, Seizures, Tingling, Tremor, Trouble walking and Weakness. Psychiatric Present- Change in Sleep Pattern. Not Present- Anxiety, Bipolar, Depression, Fearful and Frequent crying. Endocrine Present- Cold Intolerance. Not Present- Excessive Hunger, Hair Changes, Heat Intolerance, Hot flashes and New Diabetes. Hematology Present- Persistent  Infections. Not Present- Easy Bruising, Excessive bleeding, Gland problems and HIV.  Vitals Delilah Shan Moffitt RN; 01/16/2015 11:20 AM) 01/16/2015 11:20 AM Weight: 164.4 lb Height: 64in Body Surface Area: 1.84 m Body Mass Index: 28.22 kg/m Temp.: 98.25F(Oral)  Pulse: 68 (Regular)  Resp.: 16 (Unlabored)  BP: 108/76 (Sitting, Left Arm,  Standard)     Physical Exam [name] MD; 01/24/5620 11:42 AM)  General Mental Status-Alert. General Appearance-Consistent with stated age. Hydration-Well hydrated. Voice-Normal.  Head and Neck Head-normocephalic, atraumatic with no lesions or palpable masses. Trachea-midline. Thyroid Gland Characteristics - normal size and consistency.  Eye Eyeball - Bilateral-Extraocular movements intact. Sclera/Conjunctiva - Bilateral-No scleral icterus.  Chest and Lung Exam Chest and lung exam reveals -quiet, even and easy respiratory effort with no use of accessory muscles and on auscultation, normal breath sounds, no adventitious sounds and normal vocal resonance. Inspection Chest Wall - Normal. Back - normal.  Cardiovascular Cardiovascular examination reveals -normal heart sounds, regular rate and rhythm with no murmurs and normal pedal pulses bilaterally.  Abdomen Inspection Inspection of the abdomen reveals - No Hernias. Palpation/Percussion Palpation and Percussion of the abdomen reveal - Soft, Non Tender, No Rebound tenderness, No Rigidity (guarding) and No hepatosplenomegaly. Auscultation Auscultation of the abdomen reveals - Bowel sounds normal.  Rectal Anorectal Exam External - warts (L anterior). Internal - normal internal exam.  Neurologic Neurologic evaluation reveals -alert and oriented x 3 with no impairment of recent or remote memory. Mental Status-Normal.  Musculoskeletal Global Assessment -Note:no gross deformities.  Normal Exam - Left-Upper Extremity Strength Normal and Lower  Extremity Strength Normal. Normal Exam - Right-Upper Extremity Strength Normal and Lower Extremity Strength Normal.  Procedure  Other: Procedure: Anoscopy Surgeon: Marcello Moores Assistant: Moffit After the risks and benefits were explained, verbal consent was obtained for above procedure Anesthesia: none Diagnosis: anal lesion Findings: External left anterior lesion consistent with anal condyloma. No internal lesions palpated or seen on anoscopy. No significant internal hemorrhoid disease noted. Distal rectal mucosa inflamed.  Assessment & Plan [name] MD; 3/0/8657 11:42 AM)  ANAL CONDYLOMA (078.11  A63.0) Story: The 39 year old female who presents to the office with an anal lesion that has been growing in size over many years. On exam this appears consistent with anal condyloma. We discussed the various treatments of condyloma and she has agreed to proceed with laser ablation. We discussed the management of anal warts. We discussed chemical destruction, immunotherapy, and surgical excision. I discussed the pros and cons of each approach. We discussed the risk and benefits and the expected outcome with chemical destruction with agents such as podophyllin. I explained that podophyllin is generally not been effective and has a high recurrence rate. We discussed the use of Aldara ointment. I explained that it has a 30-70% chance at resolving or at least reducing the number of anal warts. I explained that it is applied 3 times a week at night and left on overnight. I explained that skin irritation is the most common side effect. We then discussed surgical excision specifically excision and fulguration. I explained how the surgery is performed. I explained that it can be painful however it generally has the highest success rate. We discussed the risk and benefits of surgery including but not limited to bleeding, infection, injury to surrounding structures, need to do a formal anoscopic exam to  evaluate for anal canal warts, urinary retention, wart recurrence, and general anesthesia risk. We discussed the typical aftercare.  Current Plans Pt Education - [facility] Anal Warts (AT) Pt Education - [facility] Constipation (AT) ANOSCOPY, DIAGNOSTIC (84696)

## 2015-02-12 ENCOUNTER — Encounter (HOSPITAL_BASED_OUTPATIENT_CLINIC_OR_DEPARTMENT_OTHER): Payer: Self-pay | Admitting: *Deleted

## 2015-02-15 ENCOUNTER — Encounter (HOSPITAL_BASED_OUTPATIENT_CLINIC_OR_DEPARTMENT_OTHER): Admission: RE | Payer: Self-pay | Source: Ambulatory Visit

## 2015-02-15 ENCOUNTER — Ambulatory Visit (HOSPITAL_BASED_OUTPATIENT_CLINIC_OR_DEPARTMENT_OTHER)
Admission: RE | Admit: 2015-02-15 | Payer: BLUE CROSS/BLUE SHIELD | Source: Ambulatory Visit | Admitting: General Surgery

## 2015-02-15 HISTORY — DX: Anogenital (venereal) warts: A63.0

## 2015-02-15 HISTORY — DX: Personal history of colon polyps, unspecified: Z86.0100

## 2015-02-15 HISTORY — DX: Personal history of colonic polyps: Z86.010

## 2015-02-15 SURGERY — ABLATION, CONDYLOMA, USING LASER
Anesthesia: Choice

## 2015-06-27 ENCOUNTER — Encounter: Payer: Self-pay | Admitting: Podiatry

## 2015-06-27 ENCOUNTER — Ambulatory Visit (INDEPENDENT_AMBULATORY_CARE_PROVIDER_SITE_OTHER): Payer: BLUE CROSS/BLUE SHIELD | Admitting: Podiatry

## 2015-06-27 ENCOUNTER — Ambulatory Visit (INDEPENDENT_AMBULATORY_CARE_PROVIDER_SITE_OTHER): Payer: BLUE CROSS/BLUE SHIELD

## 2015-06-27 VITALS — BP 100/66 | HR 80 | Resp 15 | Ht 64.0 in | Wt 163.0 lb

## 2015-06-27 DIAGNOSIS — M722 Plantar fascial fibromatosis: Secondary | ICD-10-CM

## 2015-06-27 DIAGNOSIS — M79671 Pain in right foot: Secondary | ICD-10-CM

## 2015-06-27 DIAGNOSIS — M79672 Pain in left foot: Secondary | ICD-10-CM

## 2015-06-27 MED ORDER — TRIAMCINOLONE ACETONIDE 10 MG/ML IJ SUSP
10.0000 mg | Freq: Once | INTRAMUSCULAR | Status: AC
  Administered 2015-06-27: 10 mg

## 2015-06-27 NOTE — Patient Instructions (Signed)

## 2015-06-27 NOTE — Progress Notes (Signed)
Subjective:     Patient ID: Katrina Reynolds, female   DOB: 10/22/1976, 39 y.o.   MRN: [id number]  HPI patient states I've had arch pain in both feet for several months and the left one bothers me more than the right. I've used Epson salt and hot tub which helped some but the pain seems to be gradually getting worse   Review of Systems  All other systems reviewed and are negative.      Objective:   Physical Exam  Constitutional: She is oriented to person, place, and time.  Cardiovascular: Intact distal pulses.   Musculoskeletal: Normal range of motion.  Neurological: She is oriented to person, place, and time.  Skin: Skin is warm.  Nursing note and vitals reviewed.  neurovascular status intact muscle strength adequate range of motion within normal limits with exquisite discomfort in the medial fascial band left arch and mild to moderate discomfort in the right medial fascial band. Moderate depression of the arch is noted and there is some tightness of the plantar fashion noted upon dorsiflexion of the foot. Patient has good digital perfusion is well oriented 3     Assessment:     Mid band plantar fasciitis left over right with depression of the arch as factor and condition    Plan:     H&P and x-ray reviewed with patient. Today I injected the mid arch 3 mg Kenalog 5 mg Xylocaine and applied fascial brace with instructions. Placed on oral anti-inflammatory consisting of diclofenac 75 mg twice a day and instructed on physical therapy and shoe gear modifications and discussed long-term orthotics. Reappoint in 3 weeks to review

## 2015-06-27 NOTE — Progress Notes (Signed)
   Subjective:    Patient ID: Katrina Reynolds, female    DOB: [birth year], 39 y.o.   MRN: [id number]  HPI Patient presents with bilateral foot pain, arch area, more pain in the left foot. This has been going on for the past 2 months. Pt has used epsom salt and hot tub with relief.   Review of Systems  Allergic/Immunologic: Positive for environmental allergies.  All other systems reviewed and are negative.      Objective:   Physical Exam        Assessment & Plan:

## 2015-07-18 ENCOUNTER — Ambulatory Visit: Payer: BLUE CROSS/BLUE SHIELD | Admitting: Podiatry

## 2015-10-23 ENCOUNTER — Ambulatory Visit: Payer: BLUE CROSS/BLUE SHIELD | Attending: Pain Medicine | Admitting: Pain Medicine

## 2015-10-23 ENCOUNTER — Encounter: Payer: Self-pay | Admitting: Pain Medicine

## 2015-10-23 VITALS — BP 125/76 | HR 104 | Temp 97.8°F | Resp 20 | Ht 64.0 in | Wt 162.0 lb

## 2015-10-23 DIAGNOSIS — M47816 Spondylosis without myelopathy or radiculopathy, lumbar region: Secondary | ICD-10-CM | POA: Insufficient documentation

## 2015-10-23 DIAGNOSIS — M533 Sacrococcygeal disorders, not elsewhere classified: Secondary | ICD-10-CM | POA: Diagnosis not present

## 2015-10-23 DIAGNOSIS — M5136 Other intervertebral disc degeneration, lumbar region: Secondary | ICD-10-CM | POA: Diagnosis not present

## 2015-10-23 DIAGNOSIS — K219 Gastro-esophageal reflux disease without esophagitis: Secondary | ICD-10-CM | POA: Diagnosis not present

## 2015-10-23 DIAGNOSIS — M545 Low back pain: Secondary | ICD-10-CM | POA: Diagnosis present

## 2015-10-23 DIAGNOSIS — F419 Anxiety disorder, unspecified: Secondary | ICD-10-CM | POA: Insufficient documentation

## 2015-10-23 DIAGNOSIS — F17200 Nicotine dependence, unspecified, uncomplicated: Secondary | ICD-10-CM | POA: Diagnosis not present

## 2015-10-23 DIAGNOSIS — M5416 Radiculopathy, lumbar region: Secondary | ICD-10-CM | POA: Insufficient documentation

## 2015-10-23 DIAGNOSIS — M79605 Pain in left leg: Secondary | ICD-10-CM | POA: Diagnosis present

## 2015-10-23 DIAGNOSIS — M79604 Pain in right leg: Secondary | ICD-10-CM | POA: Diagnosis present

## 2015-10-23 NOTE — Patient Instructions (Addendum)
PLAN   Continue present medication  Block of nerves to the sacroiliac joint to be performed at time of return appointment  F/U PCP [name] for evaliation of  BP and general medical  condition  F/U surgical evaluation. May consider pending follow-up evaluations  F/U neurological evaluation. May consider pending follow-up evaluations  May consider radiofrequency rhizolysis or intraspinal procedures pending response to present treatment and F/U evaluation   Patient to call Pain Management Center should patient have concerns prior to scheduled return appointment.  GENERAL RISKS AND COMPLICATIONS  What are the risk, side effects and possible complications? Generally speaking, most procedures are safe.  However, with any procedure there are risks, side effects, and the possibility of complications.  The risks and complications are dependent upon the sites that are lesioned, or the type of nerve block to be performed.  The closer the procedure is to the spine, the more serious the risks are.  Great care is taken when placing the radio frequency needles, block needles or lesioning probes, but sometimes complications can occur. 1. Infection: Any time there is an injection through the skin, there is a risk of infection.  This is why sterile conditions are used for these blocks.  There are four possible types of infection. 1. Localized skin infection. 2. Central Nervous System Infection-This can be in the form of Meningitis, which can be deadly. 3. Epidural Infections-This can be in the form of an epidural abscess, which can cause pressure inside of the spine, causing compression of the spinal cord with subsequent paralysis. This would require an emergency surgery to decompress, and there are no guarantees that the patient would recover from the paralysis. 4. Discitis-This is an infection of the intervertebral discs.  It occurs in about 1% of discography procedures.  It is difficult to treat and it  may lead to surgery.        2. Pain: the needles have to go through skin and soft tissues, will cause soreness.       3. Damage to internal structures:  The nerves to be lesioned may be near blood vessels or    other nerves which can be potentially damaged.       4. Bleeding: Bleeding is more common if the patient is taking blood thinners such as  aspirin, Coumadin, Ticiid, Plavix, etc., or if he/she have some genetic predisposition  such as hemophilia. Bleeding into the spinal canal can cause compression of the spinal  cord with subsequent paralysis.  This would require an emergency surgery to  decompress and there are no guarantees that the patient would recover from the  paralysis.       5. Pneumothorax:  Puncturing of a lung is a possibility, every time a needle is introduced in  the area of the chest or upper back.  Pneumothorax refers to free air around the  collapsed lung(s), inside of the thoracic cavity (chest cavity).  Another two possible  complications related to a similar event would include: Hemothorax and Chylothorax.   These are variations of the Pneumothorax, where instead of air around the collapsed  lung(s), you may have blood or chyle, respectively.       6. Spinal headaches: They may occur with any procedures in the area of the spine.       7. Persistent CSF (Cerebro-Spinal Fluid) leakage: This is a rare problem, but may occur  with prolonged intrathecal or epidural catheters either due to the formation of a fistulous  track  or a dural tear.       8. Nerve damage: By working so close to the spinal cord, there is always a possibility of  nerve damage, which could be as serious as a permanent spinal cord injury with  paralysis.       9. Death:  Although rare, severe deadly allergic reactions known as "Anaphylactic  reaction" can occur to any of the medications used.      10. Worsening of the symptoms:  We can always make thing worse.  What are the chances of something like this  happening? Chances of any of this occuring are extremely low.  By statistics, you have more of a chance of getting killed in a motor vehicle accident: while driving to the hospital than any of the above occurring .  Nevertheless, you should be aware that they are possibilities.  In general, it is similar to taking a shower.  Everybody knows that you can slip, hit your head and get killed.  Does that mean that you should not shower again?  Nevertheless always keep in mind that statistics do not mean anything if you happen to be on the wrong side of them.  Even if a procedure has a 1 (one) in a 1,000,000 (million) chance of going wrong, it you happen to be that one..Also, keep in mind that by statistics, you have more of a chance of having something go wrong when taking medications.  Who should not have this procedure? If you are on a blood thinning medication (e.g. Coumadin, Plavix, see list of "Blood Thinners"), or if you have an active infection going on, you should not have the procedure.  If you are taking any blood thinners, please inform your physician.  How should I prepare for this procedure?  Do not eat or drink anything at least six hours prior to the procedure.  Bring a driver with you .  It cannot be a taxi.  Come accompanied by an adult that can drive you back, and that is strong enough to help you if your legs get weak or numb from the local anesthetic.  Take all of your medicines the morning of the procedure with just enough water to swallow them.  If you have diabetes, make sure that you are scheduled to have your procedure done first thing in the morning, whenever possible.  If you have diabetes, take only half of your insulin dose and notify our nurse that you have done so as soon as you arrive at the clinic.  If you are diabetic, but only take blood sugar pills (oral hypoglycemic), then do not take them on the morning of your procedure.  You may take them after you have had the  procedure.  Do not take aspirin or any aspirin-containing medications, at least eleven (11) days prior to the procedure.  They may prolong bleeding.  Wear loose fitting clothing that may be easy to take off and that you would not mind if it got stained with Betadine or blood.  Do not wear any jewelry or perfume  Remove any nail coloring.  It will interfere with some of our monitoring equipment.  NOTE: Remember that this is not meant to be interpreted as a complete list of all possible complications.  Unforeseen problems may occur.  BLOOD THINNERS The following drugs contain aspirin or other products, which can cause increased bleeding during surgery and should not be taken for 2 weeks prior to and 1 week after surgery.  If you should need take something for relief of minor pain, you may take acetaminophen which is found in Tylenol,m Datril, Anacin-3 and Panadol. It is not blood thinner. The products listed below are.  Do not take any of the products listed below in addition to any listed on your instruction sheet.  A.P.C or A.P.C with Codeine Codeine Phosphate Capsules #3 Ibuprofen Ridaura  ABC compound Congesprin Imuran rimadil  Advil Cope Indocin Robaxisal  Alka-Seltzer Effervescent Pain Reliever and Antacid Coricidin or Coricidin-D  Indomethacin Rufen  Alka-Seltzer plus Cold Medicine Cosprin Ketoprofen S-A-C Tablets  Anacin Analgesic Tablets or Capsules Coumadin Korlgesic Salflex  Anacin Extra Strength Analgesic tablets or capsules CP-2 Tablets Lanoril Salicylate  Anaprox Cuprimine Capsules Levenox Salocol  Anexsia-D Dalteparin Magan Salsalate  Anodynos Darvon compound Magnesium Salicylate Sine-off  Ansaid Dasin Capsules Magsal Sodium Salicylate  Anturane Depen Capsules Marnal Soma  APF Arthritis pain formula Dewitt's Pills Measurin Stanback  Argesic Dia-Gesic Meclofenamic Sulfinpyrazone  Arthritis Bayer Timed Release Aspirin Diclofenac Meclomen Sulindac  Arthritis pain formula  Anacin Dicumarol Medipren Supac  Analgesic (Safety coated) Arthralgen Diffunasal Mefanamic Suprofen  Arthritis Strength Bufferin Dihydrocodeine Mepro Compound Suprol  Arthropan liquid Dopirydamole Methcarbomol with Aspirin Synalgos  ASA tablets/Enseals Disalcid Micrainin Tagament  Ascriptin Doan's Midol Talwin  Ascriptin A/D Dolene Mobidin Tanderil  Ascriptin Extra Strength Dolobid Moblgesic Ticlid  Ascriptin with Codeine Doloprin or Doloprin with Codeine Momentum Tolectin  Asperbuf Duoprin Mono-gesic Trendar  Aspergum Duradyne Motrin or Motrin IB Triminicin  Aspirin plain, buffered or enteric coated Durasal Myochrisine Trigesic  Aspirin Suppositories Easprin Nalfon Trillsate  Aspirin with Codeine Ecotrin Regular or Extra Strength Naprosyn Uracel  Atromid-S Efficin Naproxen Ursinus  Auranofin Capsules Elmiron Neocylate Vanquish  Axotal Emagrin Norgesic Verin  Azathioprine Empirin or Empirin with Codeine Normiflo Vitamin E  Azolid Emprazil Nuprin Voltaren  Bayer Aspirin plain, buffered or children's or timed BC Tablets or powders Encaprin Orgaran Warfarin Sodium  Buff-a-Comp Enoxaparin Orudis Zorpin  Buff-a-Comp with Codeine Equegesic Os-Cal-Gesic   Buffaprin Excedrin plain, buffered or Extra Strength Oxalid   Bufferin Arthritis Strength Feldene Oxphenbutazone   Bufferin plain or Extra Strength Feldene Capsules Oxycodone with Aspirin   Bufferin with Codeine Fenoprofen Fenoprofen Pabalate or Pabalate-SF   Buffets II Flogesic Panagesic   Buffinol plain or Extra Strength Florinal or Florinal with Codeine Panwarfarin   Buf-Tabs Flurbiprofen Penicillamine   Butalbital Compound Four-way cold tablets Penicillin   Butazolidin Fragmin Pepto-Bismol   Carbenicillin Geminisyn Percodan   Carna Arthritis Reliever Geopen Persantine   Carprofen Gold's salt Persistin   Chloramphenicol Goody's Phenylbutazone   Chloromycetin Haltrain Piroxlcam   Clmetidine heparin Plaquenil   Cllnoril Hyco-pap  Ponstel   Clofibrate Hydroxy chloroquine Propoxyphen         Before stopping any of these medications, be sure to consult the physician who ordered them.  Some, such as Coumadin (Warfarin) are ordered to prevent or treat serious conditions such as "deep thrombosis", "pumonary embolisms", and other heart problems.  The amount of time that you may need off of the medication may also vary with the medication and the reason for which you were taking it.  If you are taking any of these medications, please make sure you notify your pain physician before you undergo any procedures.         Pain Management Discharge Instructions  General Discharge Instructions :  If you need to reach your doctor call: Monday-Friday 8:00 am - 4:00 pm at [phone] or toll free [phone].  After clinic hours  [phone] to have operator reach doctor.  Bring all of your medication bottles to all your appointments in the pain clinic.  To cancel or reschedule your appointment with Pain Management please remember to call 24 hours in advance to avoid a fee.  Refer to the educational materials which you have been given on: General Risks, I had my Procedure. Discharge Instructions, Post Sedation.  Post Procedure Instructions:  The drugs you were given will stay in your system until tomorrow, so for the next 24 hours you should not drive, make any legal decisions or drink any alcoholic beverages.  You may eat anything you prefer, but it is better to start with liquids then soups and crackers, and gradually work up to solid foods.  Please notify your doctor immediately if you have any unusual bleeding, trouble breathing or pain that is not related to your normal pain.  Depending on the type of procedure that was done, some parts of your body may feel week and/or numb.  This usually clears up by tonight or the next day.  Walk with the use of an assistive device or accompanied by an adult for the 24  hours.  You may use ice on the affected area for the first 24 hours.  Put ice in a Ziploc bag and cover with a towel and place against area 15 minutes on 15 minutes off.  You may switch to heat after 24 hours.Selective Nerve Root Block Patient Information  Description: Specific nerve roots exit the spinal canal and these nerves can be compressed and inflamed by a bulging disc and bone spurs.  By injecting steroids on the nerve root, we can potentially decrease the inflammation surrounding these nerves, which often leads to decreased pain.  Also, by injecting local anesthesia on the nerve root, this can provide [place] helpful information to give to your referring doctor if it decreases your pain.  Selective nerve root blocks can be done along the spine from the neck to the low back depending on the location of your pain.   After numbing the skin with local anesthesia, a small needle is passed to the nerve root and the position of the needle is verified using x-ray pictures.  After the needle is in correct position, we then deposit the medication.  You may experience a pressure sensation while this is being done.  The entire block usually lasts less than 15 minutes.  Conditions that may be treated with selective nerve root blocks:  Low back and leg pain  Spinal stenosis  Diagnostic block prior to potential surgery  Neck and arm pain  Post laminectomy syndrome  Preparation for the injection:  1. Do not eat any solid food or dairy products within 6 hours of your appointment. 2. You may drink clear liquids up to 2 hours before an appointment.  Clear liquids include water, black coffee, juice or soda.  No milk or cream please. 3. You may take your regular medications, including pain medications, with a sip of water before your appointment.  Diabetics should hold regular insulin (if taken separately) and take 1/2 normal NPH dose the morning of the procedure.  Carry some sugar containing items with you  to your appointment. 4. A driver must accompany you and be prepared to drive you home after your procedure. 5. Bring all your current medications with you. 6. An IV may be inserted and sedation may be given at the discretion of the physician. 7. A blood pressure cuff, EKG, and other  monitors will often be applied during the procedure.  Some patients may need to have extra oxygen administered for a short period. 8. You will be asked to provide medical information, including allergies, prior to the procedure.  We must know immediately if you are taking blood  Thinners (like Coumadin) or if you are allergic to IV iodine contrast (dye).  Possible side-effects: All are usually temporary  Bleeding from needle site  Light headedness  Numbness and tingling  Decreased blood pressure  Weakness in arms/legs  Pressure sensation in back/neck  Pain at injection site (several days)  Possible complications: All are extremely rare  Infection  Nerve injury  Spinal headache (a headache wore with upright position)  Call if you experience:  Fever/chills associated with headache or increased back/neck pain  Headache worsened by an upright position  New onset weakness or numbness of an extremity below the injection site  Hives or difficulty breathing (go to the emergency room)  Inflammation or drainage at the injection site(s)  Severe back/neck pain greater than usual  New symptoms which are concerning to you  Please note:  Although the local anesthetic injected can often make your back or neck feel good for several hours after the injection the pain will likely return.  It takes 3-5 days for steroids to work on the nerve root. You may not notice any pain relief for at least one week.  If effective, we will often do a series of 3 injections spaced 3-6 weeks apart to maximally decrease your pain.    If you have any questions, please call [phone] [facility]

## 2015-10-23 NOTE — Progress Notes (Signed)
Fax sent and received from Delia Chimes, [place]err[place], DNP-BC,PMH-NP on 11/816 at 1508, stating OK to prescribe Oxycodone 10mg , 1 tab po QID PRN, quality 120, no refills meds written 09/24/15, fax letter placed in Dr Primus Bravo box

## 2015-10-23 NOTE — Progress Notes (Signed)
Safety precautions to be maintained throughout the outpatient stay will include: orient to surroundings, keep bed in low position, maintain call bell within reach at all times, provide assistance with transfer out of bed and ambulation.  Pt first visit

## 2015-10-23 NOTE — Progress Notes (Signed)
Subjective:    Patient ID: Katrina Reynolds, female    DOB: 1976/01/25, 39 y.o.   MRN: [id number]  HPI The patient is a 39 year old female who comes to pain management Center at the request of Reita May for further evaluation and treatment of pain involving the region of the lower back and lower extremity regions predominantly. The patient states that she has had the onset of pain of the lower back which has gradually become more intense over the years. 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 Patient also worked as a Marine scientist for several years and stated that handling heavy patients also contributed to her lower back and lower extremity pain and paresthesias. The patient is undergone prior evaluation and [facility] at [facility] and stated that she was not satisfied with her treatment there. At the present time patient states that she has pain which is aching and annoying D horrible pressure-like sensation sharp shooting throbbing pain awakening her from sleep and independent with abilities to go to sleep with pain of the lumbar region radiating to the left lower extremity and the right lower extremity. Patient also states that she has a small spur on the left hip which appears to be insignificant in terms of producing any pain. The patient states that despite interventional treatment and use of medications she continues to be severely disabling pain. We discussed patient's condition and will consider patient for interventional treatment consisting of block of nerves to the sacroiliac joint at time of return appointment in attempt to decrease severity of symptoms, minimize progression of symptoms, and avoid any focal infarct treatment. The patient was in agreement with suggested treatment plan      Review of Systems      Cardiovascular: Unremarkable   Pulmonary: Cigarette  smoking  Neurological: Unremarkable  Psychological: Anxiety   Gastrointestinal: Gastroesophageal reflux disease  Genitourinary: Kidney stones  Hematologic: Unremarkable  Endocrine: Unremarkable  Rheumatological: Osteoarthritis  Musculoskeletal: Unremarkable  Other significant: Unremarkable     Objective:   Physical Exam   There was tenderness to palpation of the splenius capitis and a separate talus musculature regions of mild degree with mild tenderness over the acromioclavicular and glenohumeral joint regions. Patient appeared to be bilaterally equal grip strength and Tinel and Phalen's maneuver without increased pain of significant degree. There was unremarkable Spurling's maneuver Palpation over the thoracic region was a tennis to palpation of mild degree with mild muscle spasms in the lower thoracic paraspinal musculature region. Palpation of the lumbar paraspinal musculature and lumbar facet region with his tinnitus to palpation of moderate degree. Lateral bending and rotation extension and palpation of the lumbar facets reproduce moderate discomfort. Palpation over the PSIS and PIIS regions reproduced severe pain and there was significant increased pain of the region of the gluteal region with patient in lateral decubitus position with pressure applied to the ileum. Patient appeared to be with positive Patrick's maneuver as well. No sensory deficit or dermatomal distribution was detected. There was negative clonus negative Homans. Abdomen nontender with no costovertebral angle tenderness noted.     Assessment & Plan:    Sacroiliac joint dysfunction  Degenerative disc disease lumbar spine  Lumbar facet syndrome  Lumbar radiculopathy    PLAN   Continue present medications Neurontin baclofen and oxycodone. The patient was advised to avoid Celebrex and to avoid nonsteroidal anti-inflammatory medications due to her GI condition  Block of nerves  to the  sacroiliac joint to be performed at time of return appointment  F/U PCP Sueanne Margarita for evaliation of  BP and general medical  condition  F/U surgical evaluation. May consider pending follow-up evaluations  F/U neurological evaluation. May consider pending follow-up evaluations  May consider radiofrequency rhizolysis or intraspinal procedures pending response to present treatment and F/U evaluation   Patient to call Pain Management Center should patient have concerns prior to scheduled return appointment.

## 2015-10-24 ENCOUNTER — Telehealth: Payer: Self-pay | Admitting: Pain Medicine

## 2015-10-24 NOTE — Telephone Encounter (Signed)
NP on Tue 10-23-15 / was told to get refill from pcp until dr crisp takes them over/ pcp was sent a fax request asking permission for dr crisp to take over meds and now will not give refill on meds / what is patient to do ?

## 2015-10-24 NOTE — Telephone Encounter (Signed)
Sent message to Dr Primus Bravo as to how to handle.

## 2015-10-24 NOTE — Telephone Encounter (Signed)
Spoke with s fuller np. She will cover patient for another month, but will no longer write for her after that. We have permission to prescribe as of today 10-24-15.

## 2015-10-24 NOTE — Telephone Encounter (Signed)
Nurses  Please call patient's prescribing physician and request him to feel patient's medications one more time. Explained to him how process and let me know today if he will do this by the patient just one more time

## 2015-11-05 ENCOUNTER — Ambulatory Visit: Payer: BLUE CROSS/BLUE SHIELD | Attending: Pain Medicine | Admitting: Pain Medicine

## 2015-11-05 ENCOUNTER — Other Ambulatory Visit: Payer: Self-pay | Admitting: Pain Medicine

## 2015-11-05 ENCOUNTER — Encounter: Payer: Self-pay | Admitting: Pain Medicine

## 2015-11-05 VITALS — BP 114/69 | HR 80 | Temp 98.6°F | Resp 18 | Ht 64.0 in | Wt 163.0 lb

## 2015-11-05 DIAGNOSIS — M79604 Pain in right leg: Secondary | ICD-10-CM | POA: Diagnosis present

## 2015-11-05 DIAGNOSIS — M545 Low back pain: Secondary | ICD-10-CM | POA: Diagnosis not present

## 2015-11-05 DIAGNOSIS — M533 Sacrococcygeal disorders, not elsewhere classified: Secondary | ICD-10-CM

## 2015-11-05 DIAGNOSIS — M5416 Radiculopathy, lumbar region: Secondary | ICD-10-CM

## 2015-11-05 DIAGNOSIS — M47816 Spondylosis without myelopathy or radiculopathy, lumbar region: Secondary | ICD-10-CM

## 2015-11-05 DIAGNOSIS — M79605 Pain in left leg: Secondary | ICD-10-CM | POA: Diagnosis present

## 2015-11-05 DIAGNOSIS — M5136 Other intervertebral disc degeneration, lumbar region: Secondary | ICD-10-CM

## 2015-11-05 MED ORDER — LIDOCAINE HCL (PF) 1 % IJ SOLN
INTRAMUSCULAR | Status: AC
  Filled 2015-11-05: qty 10

## 2015-11-05 MED ORDER — BUPIVACAINE HCL (PF) 0.25 % IJ SOLN
INTRAMUSCULAR | Status: AC
  Administered 2015-11-05: 30 mL
  Filled 2015-11-05: qty 30

## 2015-11-05 MED ORDER — BUPIVACAINE HCL (PF) 0.25 % IJ SOLN
30.0000 mL | Freq: Once | INTRAMUSCULAR | Status: AC
  Administered 2015-11-05: 30 mL

## 2015-11-05 MED ORDER — ORPHENADRINE CITRATE 30 MG/ML IJ SOLN
INTRAMUSCULAR | Status: AC
  Filled 2015-11-05: qty 2

## 2015-11-05 MED ORDER — MIDAZOLAM HCL 5 MG/5ML IJ SOLN
5.0000 mg | Freq: Once | INTRAMUSCULAR | Status: AC
  Administered 2015-11-05: 5 mg via INTRAVENOUS

## 2015-11-05 MED ORDER — MIDAZOLAM HCL 5 MG/5ML IJ SOLN
INTRAMUSCULAR | Status: AC
  Administered 2015-11-05: 5 mg via INTRAVENOUS
  Filled 2015-11-05: qty 5

## 2015-11-05 MED ORDER — TRIAMCINOLONE ACETONIDE 40 MG/ML IJ SUSP
40.0000 mg | Freq: Once | INTRAMUSCULAR | Status: AC
  Administered 2015-11-05: 40 mg

## 2015-11-05 MED ORDER — LACTATED RINGERS IV SOLN: 1000.0000 mL | INTRAVENOUS | Status: DC

## 2015-11-05 MED ORDER — FENTANYL CITRATE (PF) 100 MCG/2ML IJ SOLN
100.0000 ug | Freq: Once | INTRAMUSCULAR | Status: AC
  Administered 2015-11-05: 100 ug via INTRAVENOUS

## 2015-11-05 MED ORDER — LIDOCAINE HCL (PF) 1 % IJ SOLN: 10.0000 mL | Freq: Once | INTRAMUSCULAR | Status: DC

## 2015-11-05 MED ORDER — ORPHENADRINE CITRATE 30 MG/ML IJ SOLN: 60.0000 mg | Freq: Once | INTRAMUSCULAR | Status: DC

## 2015-11-05 MED ORDER — TRIAMCINOLONE ACETONIDE 40 MG/ML IJ SUSP
INTRAMUSCULAR | Status: AC
  Administered 2015-11-05: 40 mg
  Filled 2015-11-05: qty 1

## 2015-11-05 MED ORDER — FENTANYL CITRATE (PF) 100 MCG/2ML IJ SOLN
INTRAMUSCULAR | Status: AC
  Administered 2015-11-05: 100 ug via INTRAVENOUS
  Filled 2015-11-05: qty 2

## 2015-11-05 NOTE — Progress Notes (Signed)
Subjective:    Patient ID: Katrina Reynolds, female    DOB: 04-09-1976, 39 y.o.   MRN: [id number]  HPI  PROCEDURE:  Block of nerves to the sacroiliac joint.   NOTE:  The patient is a 39 y.o. female who returns to the [facility] for further evaluation and treatment of pain involving the lower back and lower extremity region with pain in the region of the buttocks as well. The patient is ready with reproduction of severe pain with palpation over the PSIS NP IIS regions and is with positive Patrick's maneuver.   There is concern regarding a significant component of the patient's pain being due to sacroiliac joint dysfunction The risks, benefits, expectations of the procedure have been discussed and explained to the patient who is understanding and willing to proceed with interventional treatment in attempt to decrease severity of patient's symptoms, minimize the risk of medication escalation and  hopefully retard the progression of the patient's symptoms. We will proceed with what is felt to be a medically necessary procedure, block of nerves to the sacroiliac joint.   DESCRIPTION OF PROCEDURE:  Block of nerves to the sacroiliac joint.   The patient was taken to the fluoroscopy suite. With the patient in the prone position with EKG, blood pressure, pulse and pulse oximetry monitoring, IV Versed, IV fentanyl conscious sedation, Betadine prep of proposed entry site was performed.   Block of nerves at the L5 vertebral body level.   With the patient in prone position, under fluoroscopic guidance, a 22 -gauge needle was inserted at the L5 vertebral body level on the left side. With 15 degrees oblique orientation a 22 -gauge needle was inserted in the region known as Burton's eye or eye of the Scotty dog. Following documentation of needle placement in the area of Burton's eye or eye of the Scotty dog under fluoroscopic guidance, needle placement was then accomplished at the sacral ala level on the  left side.   Needle placement at the sacral ala.   With the patient in prone position under fluoroscopic guidance with AP view of the lumbosacral spine, a 22 -gauge needle was inserted in the region known as the sacral ala on the left side. Following documentation of needle placement on the left side under fluoroscopic guidance needle placement was then accomplished at the S1 foramen level.   Needle placement at the S1 foramen level.   With the patient in prone position under fluoroscopic guidance with AP view of the lumbosacral spine and cephalad orientation, a 22 -gauge needle was inserted at the superior and lateral border of the S1 foramen on the left side. Following documentation of needle placement at the S1 foramen level on the left side, needle placement was then accomplished at the S2 foramen level on the left side.   Needle placement at the S2 foramen level.   With the patient in prone position with AP view of the lumbosacral spine with cephalad orientation, a 22 - gauge needle was inserted at the superior and lateral border of the S2 foramen under fluoroscopic guidance on the left side. Following needle placement at the L5 vertebral body level, sacral ala, S1 foramen and S2 foramen on the left side, needle placement was verified on lateral view under fluoroscopic guidance.  Following needle placement documentation on lateral view, each needle was injected with 1 mL of 0.25% bupivacaine and Kenalog.   BLOCK OF THE NERVES TO SACROILIAC JOINT ON THE RIGHT SIDE The procedure was performed on  the right side at the same levels as was performed on the left side and utilizing the same technique as on the left side and was performed under fluoroscopic guidance as on the left side   A total of 10mg  of Kenalog was utilized for the procedure.   PLAN:  1. Medications: The patient will continue presently prescribed medications.  2. The patient will be considered for modification of treatment  regimen pending response to the procedure performed on today's visit.  3. The patient is to follow-up with primary care physician [name] for evaluation of blood pressure and general medical condition following the procedure performed on today's visit.  4. Surgical evaluation as discussed.  5. Neurological evaluation as discussed.  6. The patient may be a candidate for radiofrequency procedures, implantation devices and other treatment pending response to treatment performed on today's visit and follow-up evaluation.  7. The patient has been advised to adhere to proper body mechanics and to avoid activities which may exacerbate the patient's symptoms.   Return appointment to [facility] Management Center as scheduled.        Review of Systems     Objective:   Physical Exam        Assessment & Plan:

## 2015-11-05 NOTE — Patient Instructions (Addendum)
PLAN   Continue present medication  F/U PCP [name] for evaliation of  BP and general medical  condition  F/U surgical evaluation. May consider pending follow-up evaluations  F/U neurological evaluation. May consider pending follow-up evaluations  May consider radiofrequency rhizolysis or intraspinal procedures pending response to present treatment and F/U evaluation   Patient to call Pain Management Center should patient have concerns prior to scheduled return appointment.Pain Management Discharge Instructions  General Discharge Instructions :  If you need to reach your doctor call: Monday-Friday 8:00 am - 4:00 pm at [phone] or toll free [phone].  After clinic hours [phone] to have operator reach doctor.  Bring all of your medication bottles to all your appointments in the pain clinic.  To cancel or reschedule your appointment with Pain Management please remember to call 24 hours in advance to avoid a fee.  Refer to the educational materials which you have been given on: General Risks, I had my Procedure. Discharge Instructions, Post Sedation.  Post Procedure Instructions:  The drugs you were given will stay in your system until tomorrow, so for the next 24 hours you should not drive, make any legal decisions or drink any alcoholic beverages.  You may eat anything you prefer, but it is better to start with liquids then soups and crackers, and gradually work up to solid foods.  Please notify your doctor immediately if you have any unusual bleeding, trouble breathing or pain that is not related to your normal pain.  Depending on the type of procedure that was done, some parts of your body may feel week and/or numb.  This usually clears up by tonight or the next day.  Walk with the use of an assistive device or accompanied by an adult for the 24 hours.  You may use ice on the affected area for the first 24 hours.  Put ice in a Ziploc bag and cover with a towel  and place against area 15 minutes on 15 minutes off.  You may switch to heat after 24 hours.GENERAL RISKS AND COMPLICATIONS  What are the risk, side effects and possible complications? Generally speaking, most procedures are safe.  However, with any procedure there are risks, side effects, and the possibility of complications.  The risks and complications are dependent upon the sites that are lesioned, or the type of nerve block to be performed.  The closer the procedure is to the spine, the more serious the risks are.  Great care is taken when placing the radio frequency needles, block needles or lesioning probes, but sometimes complications can occur. 1. Infection: Any time there is an injection through the skin, there is a risk of infection.  This is why sterile conditions are used for these blocks.  There are four possible types of infection. 1. Localized skin infection. 2. Central Nervous System Infection-This can be in the form of Meningitis, which can be deadly. 3. Epidural Infections-This can be in the form of an epidural abscess, which can cause pressure inside of the spine, causing compression of the spinal cord with subsequent paralysis. This would require an emergency surgery to decompress, and there are no guarantees that the patient would recover from the paralysis. 4. Discitis-This is an infection of the intervertebral discs.  It occurs in about 1% of discography procedures.  It is difficult to treat and it may lead to surgery.        2. Pain: the needles have to go through skin and soft tissues, will cause  soreness.       3. Damage to internal structures:  The nerves to be lesioned may be near blood vessels or    other nerves which can be potentially damaged.       4. Bleeding: Bleeding is more common if the patient is taking blood thinners such as  aspirin, Coumadin, Ticiid, Plavix, etc., or if he/she have some genetic predisposition  such as hemophilia. Bleeding into the spinal canal  can cause compression of the spinal  cord with subsequent paralysis.  This would require an emergency surgery to  decompress and there are no guarantees that the patient would recover from the  paralysis.       5. Pneumothorax:  Puncturing of a lung is a possibility, every time a needle is introduced in  the area of the chest or upper back.  Pneumothorax refers to free air around the  collapsed lung(s), inside of the thoracic cavity (chest cavity).  Another two possible  complications related to a similar event would include: Hemothorax and Chylothorax.   These are variations of the Pneumothorax, where instead of air around the collapsed  lung(s), you may have blood or chyle, respectively.       6. Spinal headaches: They may occur with any procedures in the area of the spine.       7. Persistent CSF (Cerebro-Spinal Fluid) leakage: This is a rare problem, but may occur  with prolonged intrathecal or epidural catheters either due to the formation of a fistulous  track or a dural tear.       8. Nerve damage: By working so close to the spinal cord, there is always a possibility of  nerve damage, which could be as serious as a permanent spinal cord injury with  paralysis.       9. Death:  Although rare, severe deadly allergic reactions known as "Anaphylactic  reaction" can occur to any of the medications used.      10. Worsening of the symptoms:  We can always make thing worse.  What are the chances of something like this happening? Chances of any of this occuring are extremely low.  By statistics, you have more of a chance of getting killed in a motor vehicle accident: while driving to the hospital than any of the above occurring .  Nevertheless, you should be aware that they are possibilities.  In general, it is similar to taking a shower.  Everybody knows that you can slip, hit your head and get killed.  Does that mean that you should not shower again?  Nevertheless always keep in mind that statistics do not  mean anything if you happen to be on the wrong side of them.  Even if a procedure has a 1 (one) in a 1,000,000 (million) chance of going wrong, it you happen to be that one..Also, keep in mind that by statistics, you have more of a chance of having something go wrong when taking medications.  Who should not have this procedure? If you are on a blood thinning medication (e.g. Coumadin, Plavix, see list of "Blood Thinners"), or if you have an active infection going on, you should not have the procedure.  If you are taking any blood thinners, please inform your physician.  How should I prepare for this procedure?  Do not eat or drink anything at least six hours prior to the procedure.  Bring a driver with you .  It cannot be a taxi.  Come accompanied by an  adult that can drive you back, and that is strong enough to help you if your legs get weak or numb from the local anesthetic.  Take all of your medicines the morning of the procedure with just enough water to swallow them.  If you have diabetes, make sure that you are scheduled to have your procedure done first thing in the morning, whenever possible.  If you have diabetes, take only half of your insulin dose and notify our nurse that you have done so as soon as you arrive at the clinic.  If you are diabetic, but only take blood sugar pills (oral hypoglycemic), then do not take them on the morning of your procedure.  You may take them after you have had the procedure.  Do not take aspirin or any aspirin-containing medications, at least eleven (11) days prior to the procedure.  They may prolong bleeding.  Wear loose fitting clothing that may be easy to take off and that you would not mind if it got stained with Betadine or blood.  Do not wear any jewelry or perfume  Remove any nail coloring.  It will interfere with some of our monitoring equipment.  NOTE: Remember that this is not meant to be interpreted as a complete list of all possible  complications.  Unforeseen problems may occur.  BLOOD THINNERS The following drugs contain aspirin or other products, which can cause increased bleeding during surgery and should not be taken for 2 weeks prior to and 1 week after surgery.  If you should need take something for relief of minor pain, you may take acetaminophen which is found in Tylenol,m Datril, Anacin-3 and Panadol. It is not blood thinner. The products listed below are.  Do not take any of the products listed below in addition to any listed on your instruction sheet.  A.P.C or A.P.C with Codeine Codeine Phosphate Capsules #3 Ibuprofen Ridaura  ABC compound Congesprin Imuran rimadil  Advil Cope Indocin Robaxisal  Alka-Seltzer Effervescent Pain Reliever and Antacid Coricidin or Coricidin-D  Indomethacin Rufen  Alka-Seltzer plus Cold Medicine Cosprin Ketoprofen S-A-C Tablets  Anacin Analgesic Tablets or Capsules Coumadin Korlgesic Salflex  Anacin Extra Strength Analgesic tablets or capsules CP-2 Tablets Lanoril Salicylate  Anaprox Cuprimine Capsules Levenox Salocol  Anexsia-D Dalteparin Magan Salsalate  Anodynos Darvon compound Magnesium Salicylate Sine-off  Ansaid Dasin Capsules Magsal Sodium Salicylate  Anturane Depen Capsules Marnal Soma  APF Arthritis pain formula Dewitt's Pills Measurin Stanback  Argesic Dia-Gesic Meclofenamic Sulfinpyrazone  Arthritis Bayer Timed Release Aspirin Diclofenac Meclomen Sulindac  Arthritis pain formula Anacin Dicumarol Medipren Supac  Analgesic (Safety coated) Arthralgen Diffunasal Mefanamic Suprofen  Arthritis Strength Bufferin Dihydrocodeine Mepro Compound Suprol  Arthropan liquid Dopirydamole Methcarbomol with Aspirin Synalgos  ASA tablets/Enseals Disalcid Micrainin Tagament  Ascriptin Doan's Midol Talwin  Ascriptin A/D Dolene Mobidin Tanderil  Ascriptin Extra Strength Dolobid Moblgesic Ticlid  Ascriptin with Codeine Doloprin or Doloprin with Codeine Momentum Tolectin  Asperbuf Duoprin  Mono-gesic Trendar  Aspergum Duradyne Motrin or Motrin IB Triminicin  Aspirin plain, buffered or enteric coated Durasal Myochrisine Trigesic  Aspirin Suppositories Easprin Nalfon Trillsate  Aspirin with Codeine Ecotrin Regular or Extra Strength Naprosyn Uracel  Atromid-S Efficin Naproxen Ursinus  Auranofin Capsules Elmiron Neocylate Vanquish  Axotal Emagrin Norgesic Verin  Azathioprine Empirin or Empirin with Codeine Normiflo Vitamin E  Azolid Emprazil Nuprin Voltaren  Bayer Aspirin plain, buffered or children's or timed BC Tablets or powders Encaprin Orgaran Warfarin Sodium  Buff-a-Comp Enoxaparin Orudis Zorpin  Buff-a-Comp with Codeine Equegesic  Os-Cal-Gesic   Buffaprin Excedrin plain, buffered or Extra Strength Oxalid   Bufferin Arthritis Strength Feldene Oxphenbutazone   Bufferin plain or Extra Strength Feldene Capsules Oxycodone with Aspirin   Bufferin with Codeine Fenoprofen Fenoprofen Pabalate or Pabalate-SF   Buffets II Flogesic Panagesic   Buffinol plain or Extra Strength Florinal or Florinal with Codeine Panwarfarin   Buf-Tabs Flurbiprofen Penicillamine   Butalbital Compound Four-way cold tablets Penicillin   Butazolidin Fragmin Pepto-Bismol   Carbenicillin Geminisyn Percodan   Carna Arthritis Reliever Geopen Persantine   Carprofen Gold's salt Persistin   Chloramphenicol Goody's Phenylbutazone   Chloromycetin Haltrain Piroxlcam   Clmetidine heparin Plaquenil   Cllnoril Hyco-pap Ponstel   Clofibrate Hydroxy chloroquine Propoxyphen         Before stopping any of these medications, be sure to consult the physician who ordered them.  Some, such as Coumadin (Warfarin) are ordered to prevent or treat serious conditions such as "deep thrombosis", "pumonary embolisms", and other heart problems.  The amount of time that you may need off of the medication may also vary with the medication and the reason for which you were taking it.  If you are taking any of these medications, please  make sure you notify your pain physician before you undergo any procedures.

## 2015-11-06 ENCOUNTER — Telehealth: Payer: Self-pay | Admitting: *Deleted

## 2015-11-06 NOTE — Telephone Encounter (Signed)
Patient denies any complications from procedure on yesterday.

## 2015-11-21 ENCOUNTER — Encounter: Payer: Self-pay | Admitting: Pain Medicine

## 2015-11-21 ENCOUNTER — Ambulatory Visit: Payer: BLUE CROSS/BLUE SHIELD | Attending: Pain Medicine | Admitting: Pain Medicine

## 2015-11-21 VITALS — BP 101/74 | HR 94 | Temp 98.5°F | Resp 16 | Ht 64.0 in | Wt 162.0 lb

## 2015-11-21 DIAGNOSIS — M545 Low back pain: Secondary | ICD-10-CM | POA: Diagnosis present

## 2015-11-21 DIAGNOSIS — M170 Bilateral primary osteoarthritis of knee: Secondary | ICD-10-CM | POA: Diagnosis not present

## 2015-11-21 DIAGNOSIS — M47896 Other spondylosis, lumbar region: Secondary | ICD-10-CM | POA: Insufficient documentation

## 2015-11-21 DIAGNOSIS — M47816 Spondylosis without myelopathy or radiculopathy, lumbar region: Secondary | ICD-10-CM

## 2015-11-21 DIAGNOSIS — M533 Sacrococcygeal disorders, not elsewhere classified: Secondary | ICD-10-CM | POA: Diagnosis not present

## 2015-11-21 DIAGNOSIS — M79604 Pain in right leg: Secondary | ICD-10-CM | POA: Diagnosis present

## 2015-11-21 DIAGNOSIS — M79605 Pain in left leg: Secondary | ICD-10-CM | POA: Diagnosis present

## 2015-11-21 DIAGNOSIS — M5416 Radiculopathy, lumbar region: Secondary | ICD-10-CM

## 2015-11-21 DIAGNOSIS — M5136 Other intervertebral disc degeneration, lumbar region: Secondary | ICD-10-CM

## 2015-11-21 MED ORDER — OXYCODONE HCL 10 MG PO TABS: ORAL_TABLET | ORAL | Status: DC

## 2015-11-21 MED ORDER — GABAPENTIN 300 MG PO CAPS: ORAL_CAPSULE | ORAL | Status: DC

## 2015-11-21 NOTE — Progress Notes (Signed)
Safety precautions to be maintained throughout the outpatient stay will include: orient to surroundings, keep bed in low position, maintain call bell within reach at all times, provide assistance with transfer out of bed and ambulation.  

## 2015-11-21 NOTE — Progress Notes (Signed)
   Subjective:    Patient ID: Katrina Reynolds, female    DOB: 1976-06-15, 39 y.o.   MRN: [id number]  HPI  The patient is a 39 year old female who returns to pain management for further evaluation and treatment of pain involving the lower back and lower extremity region. The patient states that she has had some improvement of pain with treatment in pain management Center and states that at present time pain becomes more intense near the end of the day pressure-like sensation of the lower back radiating to the left and right lower extremities. Patient states that she has pain involving the knees as well patient states that the pain is aching sensation of the lower back associated with weakness of the lower extremities after prolonged standing and walking as well as working. We discussed patient's condition and will consider patient for lumbar epidural steroid injection to be performed at time return appointment as discussed in attempt to decrease severity of symptoms, minimize progression of symptoms, and avoid the need for more involved treatment. The patient was in agreement with suggested treatment plan. The patient will continue Neurontin and oxycodone as prescribed this time.      Review of Systems     Objective:   Physical Exam There was tenderness of the splenius capitis and occipitalis musculature regions of minimal degree. The patient appeared to be with slightly decreased grip strength and Tinel and Phalen's maneuver without increased pain of significant degree. There was minimal tenderness to palpation over the acromioclavicular and glenohumeral joint regions. Palpation over the thoracic facet thoracic paraspinal musculature region was attends to palpation of mild degree. No crepitus of the thoracic region was noted. Palpation over the lumbar paraspinal musculatures and lumbar facet region was attends to palpation of moderate degree with lateral bending rotation extension and palpation of the  lumbar facets reproducing moderate severe discomfort. There was moderate tenderness to palpation of the PSIS and PII S region as well as the gluteal and piriformis musculature regions. Straight leg raise was tolerates approximately 20 without increased pain with dorsiflexion noted. There appeared to be negative clonus negative Homans. Abdomen nontender with no costovertebral tenderness noted.       Assessment & Plan:   Degenerative disc disease of the lumbar spine L3-4 facet joint degenerative changes. L4-L5 facet joint degenerative changes.  Lumbar facet syndrome  Lumbar radiculopathy  Sacroiliac joint dysfunction  Degenerative disc disease lumbar spine   Degenerative joint disease of knees   PLAN   Continue present medications  Neurontin and oxycodone  Lumbar epidural steroid injection to be performed at time return appointment  F/U PCP [name] for evaliation of  BP and general medical  condition  F/U surgical evaluation. May consider pending follow-up evaluations  Ask receptionist date of x-ray of knee  F/U neurological evaluation. May consider pending follow-up evaluations  May consider radiofrequency rhizolysis or intraspinal procedures pending response to present treatment and F/U evaluation   Patient to call Pain Management Center should patient have concerns prior to scheduled return appointment

## 2015-11-21 NOTE — Patient Instructions (Addendum)
PLAN   Continue present medications  Neurontin and oxycodone  Lumbar epidural steroid injection to be performed at time return appointment  F/U PCP [name] for evaliation of  BP and general medical  condition  F/U surgical evaluation. May consider pending follow-up evaluations  Ask receptionist date of x-ray of knee  F/U neurological evaluation. May consider pending follow-up evaluations  May consider radiofrequency rhizolysis or intraspinal procedures pending response to present treatment and F/U evaluation   Patient to call Pain Management Center should patient have concerns prior to scheduled return appointment.Epidural Steroid Injection Patient Information  Description: The epidural space surrounds the nerves as they exit the spinal cord.  In some patients, the nerves can be compressed and inflamed by a bulging disc or a tight spinal canal (spinal stenosis).  By injecting steroids into the epidural space, we can bring irritated nerves into direct contact with a potentially helpful medication.  These steroids act directly on the irritated nerves and can reduce swelling and inflammation which often leads to decreased pain.  Epidural steroids may be injected anywhere along the spine and from the neck to the low back depending upon the location of your pain.   After numbing the skin with local anesthetic (like Novocaine), a small needle is passed into the epidural space slowly.  You may experience a sensation of pressure while this is being done.  The entire block usually last less than 10 minutes.  Conditions which may be treated by epidural steroids:   Low back and leg pain  Neck and arm pain  Spinal stenosis  Post-laminectomy syndrome  Herpes zoster (shingles) pain  Pain from compression fractures  Preparation for the injection:  1. Do not eat any solid food or dairy products within 6 hours of your appointment.  2. You may drink clear liquids up to 2 hours before  appointment.  Clear liquids include water, black coffee, juice or soda.  No milk or cream please. 3. You may take your regular medication, including pain medications, with a sip of water before your appointment  Diabetics should hold regular insulin (if taken separately) and take 1/2 normal NPH dos the morning of the procedure.  Carry some sugar containing items with you to your appointment. 4. A driver must accompany you and be prepared to drive you home after your procedure.  5. Bring all your current medications with your. 6. An IV may be inserted and sedation may be given at the discretion of the physician.   7. A blood pressure cuff, EKG and other monitors will often be applied during the procedure.  Some patients may need to have extra oxygen administered for a short period. 8. You will be asked to provide medical information, including your allergies, prior to the procedure.  We must know immediately if you are taking blood thinners (like Coumadin/Warfarin)  Or if you are allergic to IV iodine contrast (dye). We must know if you could possible be pregnant.  Possible side-effects:  Bleeding from needle site  Infection (rare, may require surgery)  Nerve injury (rare)  Numbness & tingling (temporary)  Difficulty urinating (rare, temporary)  Spinal headache ( a headache worse with upright posture)  Light -headedness (temporary)  Pain at injection site (several days)  Decreased blood pressure (temporary)  Weakness in arm/leg (temporary)  Pressure sensation in back/neck (temporary)  Call if you experience:  Fever/chills associated with headache or increased back/neck pain.  Headache worsened by an upright position.  New onset weakness or numbness  of an extremity below the injection site  Hives or difficulty breathing (go to the emergency room)  Inflammation or drainage at the infection site  Severe back/neck pain  Any new symptoms which are concerning to you  Please  note:  Although the local anesthetic injected can often make your back or neck feel good for several hours after the injection, the pain will likely return.  It takes 3-7 days for steroids to work in the epidural space.  You may not notice any pain relief for at least that one week.  If effective, we will often do a series of three injections spaced 3-6 weeks apart to maximally decrease your pain.  After the initial series, we generally will wait several months before considering a repeat injection of the same type.  If you have any questions, please call [phone] [facility]

## 2015-11-26 ENCOUNTER — Ambulatory Visit: Payer: BLUE CROSS/BLUE SHIELD | Admitting: Pain Medicine

## 2015-12-05 ENCOUNTER — Ambulatory Visit: Admission: RE | Admit: 2015-12-05 | Payer: BLUE CROSS/BLUE SHIELD | Source: Ambulatory Visit | Admitting: Pain Medicine

## 2015-12-05 ENCOUNTER — Ambulatory Visit: Payer: BLUE CROSS/BLUE SHIELD | Attending: Pain Medicine | Admitting: Pain Medicine

## 2015-12-05 ENCOUNTER — Encounter: Payer: Self-pay | Admitting: Pain Medicine

## 2015-12-05 VITALS — BP 117/86 | HR 82 | Temp 98.5°F | Resp 16 | Ht 64.0 in | Wt 163.0 lb

## 2015-12-05 DIAGNOSIS — M5136 Other intervertebral disc degeneration, lumbar region: Secondary | ICD-10-CM | POA: Diagnosis not present

## 2015-12-05 DIAGNOSIS — M47816 Spondylosis without myelopathy or radiculopathy, lumbar region: Secondary | ICD-10-CM | POA: Insufficient documentation

## 2015-12-05 DIAGNOSIS — M545 Low back pain: Secondary | ICD-10-CM | POA: Diagnosis present

## 2015-12-05 DIAGNOSIS — M533 Sacrococcygeal disorders, not elsewhere classified: Secondary | ICD-10-CM

## 2015-12-05 DIAGNOSIS — M170 Bilateral primary osteoarthritis of knee: Secondary | ICD-10-CM

## 2015-12-05 DIAGNOSIS — M5416 Radiculopathy, lumbar region: Secondary | ICD-10-CM

## 2015-12-05 MED ORDER — LIDOCAINE HCL (PF) 1 % IJ SOLN
INTRAMUSCULAR | Status: AC
  Administered 2015-12-05: 12:00:00
  Filled 2015-12-05: qty 5

## 2015-12-05 MED ORDER — SODIUM CHLORIDE 0.9 % IJ SOLN
INTRAMUSCULAR | Status: AC
  Administered 2015-12-05: 13:00:00
  Filled 2015-12-05: qty 20

## 2015-12-05 MED ORDER — TRIAMCINOLONE ACETONIDE 40 MG/ML IJ SUSP: 40.0000 mg | Freq: Once | INTRAMUSCULAR | Status: DC

## 2015-12-05 MED ORDER — LIDOCAINE HCL (PF) 1 % IJ SOLN: 10.0000 mL | Freq: Once | INTRAMUSCULAR | Status: DC

## 2015-12-05 MED ORDER — SODIUM CHLORIDE 0.9 % IJ SOLN: 20.0000 mL | Freq: Once | INTRAMUSCULAR | Status: DC

## 2015-12-05 MED ORDER — TRIAMCINOLONE ACETONIDE 40 MG/ML IJ SUSP
INTRAMUSCULAR | Status: AC
Start: 2015-12-05 — End: 2015-12-05
  Administered 2015-12-05: 13:00:00
  Filled 2015-12-05: qty 1

## 2015-12-05 MED ORDER — FENTANYL CITRATE (PF) 100 MCG/2ML IJ SOLN
INTRAMUSCULAR | Status: AC
  Filled 2015-12-05: qty 2

## 2015-12-05 MED ORDER — BUPIVACAINE HCL (PF) 0.25 % IJ SOLN
INTRAMUSCULAR | Status: AC
  Administered 2015-12-05: 12:00:00
  Filled 2015-12-05: qty 30

## 2015-12-05 MED ORDER — MIDAZOLAM HCL 5 MG/5ML IJ SOLN
INTRAMUSCULAR | Status: AC
  Filled 2015-12-05: qty 5

## 2015-12-05 NOTE — Progress Notes (Signed)
Safety precautions to be maintained throughout the outpatient stay will include: orient to surroundings, keep bed in low position, maintain call bell within reach at all times, provide assistance with transfer out of bed and ambulation.  

## 2015-12-05 NOTE — Patient Instructions (Addendum)
  PLAN   Continue present medications  Neurontin and oxycodone  F/U PCP [name] for evaliation of  BP and general medical  condition  F/U surgical evaluation. May consider pending follow-up evaluations  Ask receptionist date of x-ray of knee  F/U neurological evaluation. May consider pending follow-up evaluations  May consider radiofrequency rhizolysis or intraspinal procedures pending response to present treatment and F/U evaluation   Patient to call Pain Management Center should patient have concerns prior to scheduled return appointment  Pain Management Discharge Instructions  General Discharge Instructions :  If you need to reach your doctor call: Monday-Friday 8:00 am - 4:00 pm at [phone] or toll free [phone].  After clinic hours [phone] to have operator reach doctor.  Bring all of your medication bottles to all your appointments in the pain clinic.  To cancel or reschedule your appointment with Pain Management please remember to call 24 hours in advance to avoid a fee.  Refer to the educational materials which you have been given on: General Risks, I had my Procedure. Discharge Instructions, Post Sedation.  Post Procedure Instructions:   You may eat anything you prefer, but it is better to start with liquids then soups and crackers, and gradually work up to solid foods.  Please notify your doctor immediately if you have any unusual bleeding, trouble breathing or pain that is not related to your normal pain.  Depending on the type of procedure that was done, some parts of your body may feel week and/or numb.  This usually clears up by tonight or the next day.  Walk with the use of an assistive device or accompanied by an adult for the 24 hours.  You may use ice on the affected area for the first 24 hours.  Put ice in a Ziploc bag and cover with a towel and place against area 15 minutes on 15 minutes off.  You may switch to heat after 24 hours.

## 2015-12-05 NOTE — Progress Notes (Signed)
   Subjective:    Patient ID: Katrina Reynolds, female    DOB: 12-11-1976, 39 y.o.   MRN: [id number]  HPI  PROCEDURE PERFORMED: Lumbar epidural steroid injection   NOTE: The patient is a 39 y.o. female who returns to [name] for further evaluation and treatment of pain involving the lumbar and lower extremity region.  MRI revealed the patient to be with  disc disease of the lumbar spine with L3-4 facet joint degenerative changes. L4-L5 facet joint degenerative changes.. There is concern regarding intraspinal abnormalities contributing to patient's symptomatology with concern regarding component of facet syndrome as well as lumbar radiculopathy. The risks, benefits, and expectations of the procedure have been discussed and explained to the patient who was understanding and in agreement with suggested treatment plan. We will proceed with lumbar epidural steroid injection as discussed and as explained to the patient who is willing to proceed with procedure as planned.   DESCRIPTION OF PROCEDURE: Lumbar epidural steroid injection with  EKG, blood pressure, pulse, and pulse oximetry monitoring. The procedure was performed with the patient in the prone position under fluoroscopic guidance. A local anesthetic skin wheal of 1.5% plain lidocaine was accomplished at proposed entry site. An 18-gauge Tuohy epidural needle was inserted at the L  for vertebral body level  right of the midline via loss-of-resistance technique with negative heme and negative CSF return. A total of 4 mL of Preservative-Free normal saline with 40 mg of Kenalog injected incrementally via epidurally placed needle. Needle was removed.    A total of 40 mg of Kenalog was utilized for the procedure.   The patient tolerated the injection well.    PLAN:   1. Medications: We will continue presently prescribed medications. Neurontin and oxycodone 2. Will consider modification of treatment regimen pending response to treatment  rendered on today's visit and follow-up evaluation. 3. The patient is to follow-up with primary care physician   [name] regarding blood pressure and general medical condition status post lumbar epidural steroid injection performed on today's visit. 4. Surgical evaluation. Has been addressed 5. Neurological evaluation. May consider [facility] EMG studies 6. MRI of knee to evaluate for abnormalities 7. The patient may be a candidate for radiofrequency procedures, implantation device, and other treatment pending response to treatment and follow-up evaluation. 8. The patient has been advised to adhere to proper body mechanics and avoid activities which appear to aggravate condition. 9. The patient has been advised to call the Pain Management Center prior to scheduled return appointment should there be significant change in condition or should there be sign  The patient is understanding and agrees with the suggested  treatment plan   Review of Systems     Objective:   Physical Exam        Assessment & Plan:

## 2015-12-06 ENCOUNTER — Telehealth: Payer: Self-pay

## 2015-12-06 NOTE — Telephone Encounter (Signed)
Post procedure call.  Pt states she is doing fine.

## 2015-12-16 DIAGNOSIS — K5790 Diverticulosis of intestine, part unspecified, without perforation or abscess without bleeding: Secondary | ICD-10-CM

## 2015-12-16 HISTORY — DX: Diverticulosis of intestine, part unspecified, without perforation or abscess without bleeding: K57.90

## 2015-12-17 ENCOUNTER — Emergency Department (HOSPITAL_COMMUNITY)
Admission: EM | Admit: 2015-12-17 | Discharge: 2015-12-17 | Disposition: A | Discharge status: Home / Self Care | Payer: BLUE CROSS/BLUE SHIELD | Attending: Emergency Medicine | Admitting: Emergency Medicine

## 2015-12-17 ENCOUNTER — Encounter (HOSPITAL_COMMUNITY): Payer: Self-pay | Admitting: *Deleted

## 2015-12-17 ENCOUNTER — Emergency Department (HOSPITAL_COMMUNITY): Payer: BLUE CROSS/BLUE SHIELD

## 2015-12-17 DIAGNOSIS — Z8739 Personal history of other diseases of the musculoskeletal system and connective tissue: Secondary | ICD-10-CM | POA: Diagnosis not present

## 2015-12-17 DIAGNOSIS — Z79891 Long term (current) use of opiate analgesic: Secondary | ICD-10-CM | POA: Insufficient documentation

## 2015-12-17 DIAGNOSIS — F419 Anxiety disorder, unspecified: Secondary | ICD-10-CM | POA: Insufficient documentation

## 2015-12-17 DIAGNOSIS — F17210 Nicotine dependence, cigarettes, uncomplicated: Secondary | ICD-10-CM | POA: Diagnosis not present

## 2015-12-17 DIAGNOSIS — G8929 Other chronic pain: Secondary | ICD-10-CM | POA: Insufficient documentation

## 2015-12-17 DIAGNOSIS — R109 Unspecified abdominal pain: Secondary | ICD-10-CM | POA: Diagnosis present

## 2015-12-17 DIAGNOSIS — Z79899 Other long term (current) drug therapy: Secondary | ICD-10-CM | POA: Insufficient documentation

## 2015-12-17 DIAGNOSIS — Z86010 Personal history of colonic polyps: Secondary | ICD-10-CM | POA: Insufficient documentation

## 2015-12-17 DIAGNOSIS — Z87442 Personal history of urinary calculi: Secondary | ICD-10-CM | POA: Diagnosis not present

## 2015-12-17 DIAGNOSIS — Z3202 Encounter for pregnancy test, result negative: Secondary | ICD-10-CM | POA: Insufficient documentation

## 2015-12-17 DIAGNOSIS — Z872 Personal history of diseases of the skin and subcutaneous tissue: Secondary | ICD-10-CM | POA: Diagnosis not present

## 2015-12-17 DIAGNOSIS — Z9889 Other specified postprocedural states: Secondary | ICD-10-CM | POA: Diagnosis not present

## 2015-12-17 DIAGNOSIS — A084 Viral intestinal infection, unspecified: Secondary | ICD-10-CM | POA: Diagnosis not present

## 2015-12-17 DIAGNOSIS — N189 Chronic kidney disease, unspecified: Secondary | ICD-10-CM | POA: Diagnosis not present

## 2015-12-17 LAB — URINALYSIS, ROUTINE W REFLEX MICROSCOPIC
Bilirubin Urine: NEGATIVE
Glucose, UA: NEGATIVE mg/dL
Ketones, ur: NEGATIVE mg/dL
LEUKOCYTES UA: NEGATIVE
Nitrite: NEGATIVE
PROTEIN: 30 mg/dL — AB
pH: 5 (ref 5.0–8.0)

## 2015-12-17 LAB — COMPREHENSIVE METABOLIC PANEL
ALBUMIN: 4.5 g/dL (ref 3.5–5.0)
ALK PHOS: 80 U/L (ref 38–126)
ALT: 16 U/L (ref 14–54)
ANION GAP: 10 (ref 5–15)
AST: 21 U/L (ref 15–41)
BILIRUBIN TOTAL: 0.6 mg/dL (ref 0.3–1.2)
BUN: 19 mg/dL (ref 6–20)
CALCIUM: 9.5 mg/dL (ref 8.9–10.3)
CO2: 26 mmol/L (ref 22–32)
CREATININE: 0.95 mg/dL (ref 0.44–1.00)
Chloride: 105 mmol/L (ref 101–111)
GFR calc Af Amer: >60 mL/min (ref >60)
GFR calc non Af Amer: >60 mL/min (ref >60)
Glucose, Bld: 138 mg/dL — ABNORMAL HIGH (ref 65–99)
Potassium: 3.9 mmol/L (ref 3.5–5.1)
Sodium: 141 mmol/L (ref 135–145)
Total Protein: 7.9 g/dL (ref 6.5–8.1)

## 2015-12-17 LAB — CBC
HCT: 53.4 % — ABNORMAL HIGH (ref 36.0–46.0)
HEMOGLOBIN: 18.9 g/dL — AB (ref 12.0–15.0)
MCH: 32.5 pg (ref 26.0–34.0)
MCHC: 35.4 g/dL (ref 30.0–36.0)
MCV: 91.9 fL (ref 78.0–100.0)
PLATELETS: 286 10*3/uL (ref 150–400)
RBC: 5.81 MIL/uL — AB (ref 3.87–5.11)
RDW: 13.8 % (ref 11.5–15.5)
WBC: 22.2 10*3/uL — AB (ref 4.0–10.5)

## 2015-12-17 LAB — URINE MICROSCOPIC-ADD ON: WBC, UA: NONE SEEN WBC/hpf (ref 0–5)

## 2015-12-17 LAB — PREGNANCY, URINE: Preg Test, Ur: NEGATIVE

## 2015-12-17 LAB — LIPASE, BLOOD: Lipase: 25 U/L (ref 11–51)

## 2015-12-17 MED ORDER — IOHEXOL 300 MG/ML  SOLN
100.0000 mL | Freq: Once | INTRAMUSCULAR | Status: AC | PRN
  Administered 2015-12-17: 100 mL via INTRAVENOUS

## 2015-12-17 MED ORDER — DIPHENOXYLATE-ATROPINE 2.5-0.025 MG PO TABS
2.0000 | ORAL_TABLET | Freq: Once | ORAL | Status: AC
  Administered 2015-12-17: 2 via ORAL
  Filled 2015-12-17: qty 2

## 2015-12-17 MED ORDER — ONDANSETRON HCL 4 MG/2ML IJ SOLN
4.0000 mg | Freq: Once | INTRAMUSCULAR | Status: AC
  Administered 2015-12-17: 4 mg via INTRAMUSCULAR
  Filled 2015-12-17: qty 2

## 2015-12-17 MED ORDER — DIPHENOXYLATE-ATROPINE 2.5-0.025 MG PO TABS: 1.0000 | ORAL_TABLET | Freq: Four times a day (QID) | ORAL | Status: DC | PRN

## 2015-12-17 MED ORDER — ONDANSETRON 8 MG PO TBDP: 8.0000 mg | ORAL_TABLET | Freq: Three times a day (TID) | ORAL | Status: DC | PRN

## 2015-12-17 MED ORDER — SODIUM CHLORIDE 0.9 % IV BOLUS (SEPSIS)
1000.0000 mL | Freq: Once | INTRAVENOUS | Status: AC
  Administered 2015-12-17: 1000 mL via INTRAVENOUS

## 2015-12-17 MED ORDER — MORPHINE SULFATE (PF) 4 MG/ML IV SOLN
4.0000 mg | Freq: Once | INTRAVENOUS | Status: AC
  Administered 2015-12-17: 4 mg via INTRAVENOUS
  Filled 2015-12-17: qty 1

## 2015-12-17 NOTE — Discharge Instructions (Signed)
Nausea and Vomiting  Nausea means you feel sick to your stomach. Throwing up (vomiting) is a reflex where stomach contents come out of your mouth.  HOME CARE   · Take medicine as told by your doctor.  · Do not force yourself to eat. However, you do need to drink fluids.  · If you feel like eating, eat a normal diet as told by your doctor.    Eat rice, wheat, potatoes, bread, lean meats, yogurt, fruits, and vegetables.    Avoid high-fat foods.  · Drink enough fluids to keep your pee (urine) clear or pale yellow.  · Ask your doctor how to replace body fluid losses (rehydrate). Signs of body fluid loss (dehydration) include:    Feeling very thirsty.    Dry lips and mouth.    Feeling dizzy.    Dark pee.    Peeing less than normal.    Feeling confused.    Fast breathing or heart rate.  GET HELP RIGHT AWAY IF:   · You have blood in your throw up.  · You have black or bloody poop (stool).  · You have a bad headache or stiff neck.  · You feel confused.  · You have bad belly (abdominal) pain.  · You have chest pain or trouble breathing.  · You do not pee at least once every 8 hours.  · You have cold, clammy skin.  · You keep throwing up after 24 to 48 hours.  · You have a fever.  MAKE SURE YOU:   · Understand these instructions.  · Will watch your condition.  · Will get help right away if you are not doing well or get worse.     This information is not intended to replace advice given to you by your health care provider. Make sure you discuss any questions you have with your health care provider.     Document Released: 05/19/2008 Document Revised: 02/23/2012 Document Reviewed: 05/02/2011  Elsevier Interactive Patient Education ©2016 [employer]

## 2015-12-17 NOTE — ED Notes (Signed)
patient

## 2015-12-17 NOTE — ED Notes (Signed)
Pr refusing to take off t shirt.

## 2015-12-17 NOTE — ED Notes (Signed)
Pt reports abd pain that started around 0200 this morning. N/V/D.
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
## 2015-12-20 ENCOUNTER — Ambulatory Visit: Payer: BLUE CROSS/BLUE SHIELD | Attending: Pain Medicine | Admitting: Pain Medicine

## 2015-12-20 ENCOUNTER — Encounter: Payer: Self-pay | Admitting: Pain Medicine

## 2015-12-20 VITALS — BP 111/85 | HR 83 | Temp 98.5°F | Resp 16 | Wt 163.0 lb

## 2015-12-20 DIAGNOSIS — M170 Bilateral primary osteoarthritis of knee: Secondary | ICD-10-CM | POA: Diagnosis not present

## 2015-12-20 DIAGNOSIS — M533 Sacrococcygeal disorders, not elsewhere classified: Secondary | ICD-10-CM | POA: Insufficient documentation

## 2015-12-20 DIAGNOSIS — M5116 Intervertebral disc disorders with radiculopathy, lumbar region: Secondary | ICD-10-CM | POA: Insufficient documentation

## 2015-12-20 DIAGNOSIS — M47896 Other spondylosis, lumbar region: Secondary | ICD-10-CM | POA: Diagnosis not present

## 2015-12-20 DIAGNOSIS — K625 Hemorrhage of anus and rectum: Secondary | ICD-10-CM | POA: Insufficient documentation

## 2015-12-20 DIAGNOSIS — M5136 Other intervertebral disc degeneration, lumbar region: Secondary | ICD-10-CM

## 2015-12-20 DIAGNOSIS — M47816 Spondylosis without myelopathy or radiculopathy, lumbar region: Secondary | ICD-10-CM

## 2015-12-20 DIAGNOSIS — M5416 Radiculopathy, lumbar region: Secondary | ICD-10-CM

## 2015-12-20 DIAGNOSIS — M545 Low back pain: Secondary | ICD-10-CM | POA: Diagnosis present

## 2015-12-20 DIAGNOSIS — M79606 Pain in leg, unspecified: Secondary | ICD-10-CM | POA: Diagnosis present

## 2015-12-20 MED ORDER — OXYCODONE HCL 10 MG PO TABS: ORAL_TABLET | ORAL | Status: DC

## 2015-12-20 MED ORDER — GABAPENTIN 300 MG PO CAPS: ORAL_CAPSULE | ORAL | Status: DC

## 2015-12-20 NOTE — Progress Notes (Signed)
Subjective:    Patient ID: Katrina Reynolds, female    DOB: May 15, 1976, 40 y.o.   MRN: [id number]  HPI  The patient is a 40 year old female who returns to pain management Center for further evaluation and treatment of pain involving the region of the lower back and lower extremity region predominantly. The patient states she has significant improvement of her pain following previous procedure performed in pain management Center. Patient denies any trauma change in events of daily living the call significant change in symptomatology. The patient admits to significant relief of pain following previous procedure lumbar epidural steroid injection. Patient admits to some residual pain of the lower back lower extremity region with codeine more intense as the day progresses. Patient also states that she has pain radiating to the lower extremity at times as well. We discussed patient's overall condition and near the end of the evaluation patient went to bathroom and when patient returned from bathroom patient complained of bleeding per rectum. We informed patient that we wish to have patient undergo immediate follow-up evaluation with her primary care physician Reita May and that patient should also address further evaluation considering GI evaluation. Patient was understanding and agreement suggested treatment plan. At the present time patient will continue Neurontin and oxycodone as prescribed. We will await results of further evaluation and consider modification of treatment regimen pending follow-up evaluation. The patient agreed to suggested treatment plan    Review of Systems     Objective:   Physical Exam  There was tenderness to palpation of the splenius capitis and occipitalis musculature regions. Palpation of these regions reproduced mild to moderate discomfort. There was mild to moderate tenderness to palpation over the cervical facet cervical paraspinal musculature region. Palpation of the  acromioclavicular and glenohumeral joint regions reproduce mild discomfort. There was unremarkable Spurling's maneuver. Patient appeared to be with out increased pain with Tinel and Phalen's maneuver and appeared to be with bilaterally equal grip strength. Palpation over the thoracic facet thoracic paraspinal musculature region was attends to palpation with crepitus of the thoracic region noted. Palpation over the lumbar paraspinal musculature region lumbar facet region associated with mild to moderate discomfort. Lateral bending rotation extension and palpation of the lumbar facets reproduce mild to moderate discomfort. There was tends to palpation over the PSIS and PII S region as well as the gluteal and piriformis musculature region. There was mild tenderness to palpation of the greater trochanteric region and iliotibial band region. Reevaluation revealed patient to be with moderate tends to palpation of the greater trochanteric region and iliotibial band region. DTRs were difficult to this patient had difficulty relaxing. There was no definite sensory deficit or dermatomal distribution detected. There was negative clonus negative Homans. Abdomen nontender with no costovertebral tenderness noted.      Assessment & Plan:    Degenerative disc disease of the lumbar spine L3-4 facet joint degenerative changes. L4-L5 facet joint degenerative changes.  Lumbar facet syndrome  Lumbar radiculopathy  Sacroiliac joint dysfunction  Degenerative disc disease lumbar spine   Degenerative joint disease of knees  Rectal bleeding     PLAN   Continue present medications  Neurontin and oxycodone  F/U PCP [name] for evaliation of  BP URI symptoms , bleeding per rectum as we discussed today and for evaluation of general medical  condition. Need to proceed with GI evaluation for further evaluation of bleeding per rectum  F/U surgical evaluation. May consider pending follow-up evaluations  Ask  receptionist date of x-ray  of knee Left Knee  F/U neurological evaluation. May consider pending follow-up evaluations  May consider radiofrequency rhizolysis or intraspinal procedures pending response to present treatment and F/U evaluation   Patient to call Pain Management Center should patient have concerns prior to scheduled return appointment

## 2015-12-20 NOTE — Patient Instructions (Addendum)
  PLAN   Continue present medications  Neurontin and oxycodone  F/U PCP [name] for evaliation of  BP URI symptoms , bleeding as we discussed today and for evaluation of general medical  condition. Need to proceed with GI evaluation  F/U surgical evaluation. May consider pending follow-up evaluations  Ask receptionist date of x-ray of knee Left Knee  F/U neurological evaluation. May consider pending follow-up evaluations  May consider radiofrequency rhizolysis or intraspinal procedures pending response to present treatment and F/U evaluation   Patient to call [facility] Center should patient have concerns prior to scheduled return appointment

## 2015-12-20 NOTE — Progress Notes (Signed)
Safety precautions to be maintained throughout the outpatient stay will include: orient to surroundings, keep bed in low position, maintain call bell within reach at all times, provide assistance with transfer out of bed and ambulation.  

## 2016-01-06 ENCOUNTER — Other Ambulatory Visit: Payer: Self-pay | Admitting: Pain Medicine

## 2016-01-06 DIAGNOSIS — M179 Osteoarthritis of knee, unspecified: Secondary | ICD-10-CM | POA: Insufficient documentation

## 2016-01-06 DIAGNOSIS — M170 Bilateral primary osteoarthritis of knee: Secondary | ICD-10-CM

## 2016-01-06 DIAGNOSIS — M1710 Unilateral primary osteoarthritis, unspecified knee: Secondary | ICD-10-CM | POA: Insufficient documentation

## 2016-01-09 ENCOUNTER — Other Ambulatory Visit: Payer: Self-pay | Admitting: Pain Medicine

## 2016-01-09 DIAGNOSIS — M170 Bilateral primary osteoarthritis of knee: Secondary | ICD-10-CM

## 2016-01-16 ENCOUNTER — Encounter (INDEPENDENT_AMBULATORY_CARE_PROVIDER_SITE_OTHER): Payer: Self-pay

## 2016-01-16 ENCOUNTER — Encounter: Payer: Self-pay | Admitting: Pain Medicine

## 2016-01-16 ENCOUNTER — Ambulatory Visit: Payer: BLUE CROSS/BLUE SHIELD | Attending: Pain Medicine | Admitting: Pain Medicine

## 2016-01-16 VITALS — BP 119/83 | HR 92 | Temp 98.2°F | Resp 18 | Ht 64.0 in | Wt 164.0 lb

## 2016-01-16 DIAGNOSIS — M5416 Radiculopathy, lumbar region: Secondary | ICD-10-CM

## 2016-01-16 DIAGNOSIS — K625 Hemorrhage of anus and rectum: Secondary | ICD-10-CM | POA: Diagnosis not present

## 2016-01-16 DIAGNOSIS — M533 Sacrococcygeal disorders, not elsewhere classified: Secondary | ICD-10-CM | POA: Insufficient documentation

## 2016-01-16 DIAGNOSIS — M5116 Intervertebral disc disorders with radiculopathy, lumbar region: Secondary | ICD-10-CM | POA: Insufficient documentation

## 2016-01-16 DIAGNOSIS — M545 Low back pain: Secondary | ICD-10-CM | POA: Diagnosis present

## 2016-01-16 DIAGNOSIS — M47896 Other spondylosis, lumbar region: Secondary | ICD-10-CM | POA: Diagnosis not present

## 2016-01-16 DIAGNOSIS — M5136 Other intervertebral disc degeneration, lumbar region: Secondary | ICD-10-CM

## 2016-01-16 DIAGNOSIS — M170 Bilateral primary osteoarthritis of knee: Secondary | ICD-10-CM | POA: Insufficient documentation

## 2016-01-16 DIAGNOSIS — M79606 Pain in leg, unspecified: Secondary | ICD-10-CM | POA: Diagnosis present

## 2016-01-16 DIAGNOSIS — M47816 Spondylosis without myelopathy or radiculopathy, lumbar region: Secondary | ICD-10-CM

## 2016-01-16 MED ORDER — OXYCODONE HCL 10 MG PO TABS: ORAL_TABLET | ORAL | Status: DC

## 2016-01-16 MED ORDER — GABAPENTIN 300 MG PO CAPS: ORAL_CAPSULE | ORAL | Status: DC

## 2016-01-16 NOTE — Progress Notes (Signed)
Safety precautions to be maintained throughout the outpatient stay will include: orient to surroundings, keep bed in low position, maintain call bell within reach at all times, provide assistance with transfer out of bed and ambulation.  

## 2016-01-16 NOTE — Patient Instructions (Addendum)
  PLAN   Continue present medications  Neurontin and oxycodone  F/U PCP [name] for evaliation of  BP and for evaluation of general medical condition also [facility] for [facility] evaluation  GI evaluation at [facility] as discussed  F/U surgical evaluation. May consider pending follow-up evaluations  F/U neurological evaluation. May consider pending follow-up evaluations  May consider radiofrequency rhizolysis or intraspinal procedures pending response to present treatment and F/U evaluation   Patient to call [facility] Management Center should patient have concerns prior to scheduled return appointment

## 2016-01-16 NOTE — Progress Notes (Signed)
   Subjective:    Patient ID: Merika Wythe, female    DOB: [birth year], 40 y.o.   MRN: [id number]  HPI  The patient is a 40 year old female who returns to pain management for further evaluation and treatment of pain involving the lower back and lower extremity region. The patient has had bleeding per rectum and will follow-up with Dr. [name] as previously plan. The patient will also request referral for GI evaluation at [facility]. The patient is undergone prior colonoscopies by gastroenterologist. At the present time we will continue present medications and will await GI evaluation as discussed. The patient denies any trauma change in events of daily living the call significant change in symptomatology. The patient also wishes to proceed with MRI of the knee and we have placed order for MRI of knee to be obtained. The patient will continue medications Neurontin and oxycodone and will call pain management if this change in condition prior to scheduled return appointment. All were in agreement with suggested treatment plan          Review of Systems     Objective:   Physical Exam  There was tenderness to palpation of paraspinal musculature and cervical region cervical facet region palpation which reproduces mild discomfort with mild tenderness to palpation over the splenius capitis and occipitalis musculature regions. Palpation of the acromioclavicular and glenohumeral joint regions reproduced pain of mild degree. The patient appeared to be with bilaterally equal grip strength and Tinel and Phalen's maneuver were without increase of pain of significant degree. There was unremarkable Spurling's maneuver. Palpation over the thoracic facet thoracic paraspinal musculature region was attends to palpation of moderate degree in the lower thoracic region with no crepitus of the thoracic region noted. Palpation over the lumbar paraspinal musculatures and lumbar facet region was attends to  palpation on the left as well as on the right with moderate tenderness to palpation of the PSIS and PII S regions. Straight leg raise was tolerates approximately 30 without increase of pain with dorsiflexion noted. There was mild tenderness on the greater trochanteric region and iliotibial band region. DTRs were difficult to elicit appeared to be trace at the knees. No definite sensory deficit or dermatomal distribution was detected. There was negative clonus negative Homans. Abdomen was without excessive tends to palpation of costovertebral tenderness was noted    Assessment & Plan:    Degenerative disc disease of the lumbar spine L3-4 facet joint degenerative changes. L4-L5 facet joint degenerative changes.  Lumbar facet syndrome  Lumbar radiculopathy  Sacroiliac joint dysfunction  Degenerative disc disease lumbar spine   Degenerative joint disease of knees      PLAN   Continue present medications  Neurontin and oxycodone  F/U PCP [name] for evaliation of  BP and for evaluation of general medical condition also discuss [facility] for GI evaluation  GI evaluation at [facility] as discussed  F/U surgical evaluation. May consider pending follow-up evaluations  Ask receptionist date of MRI of the left knee  F/U neurological evaluation. May consider pending follow-up evaluations  May consider radiofrequency rhizolysis or intraspinal procedures pending response to present treatment and F/U evaluation   Patient to call [facility] Management Center should patient have concerns prior to scheduled return appointment

## 2016-02-01 ENCOUNTER — Other Ambulatory Visit: Payer: Self-pay | Admitting: Pain Medicine

## 2016-02-01 ENCOUNTER — Other Ambulatory Visit: Payer: Self-pay | Admitting: Orthopedic Surgery

## 2016-02-01 DIAGNOSIS — M25562 Pain in left knee: Secondary | ICD-10-CM

## 2016-02-13 ENCOUNTER — Ambulatory Visit: Payer: BLUE CROSS/BLUE SHIELD | Attending: Pain Medicine | Admitting: Pain Medicine

## 2016-02-13 ENCOUNTER — Encounter: Payer: Self-pay | Admitting: Pain Medicine

## 2016-02-13 VITALS — BP 104/71 | HR 83 | Temp 97.7°F | Resp 16 | Ht 64.0 in | Wt 166.0 lb

## 2016-02-13 DIAGNOSIS — M47896 Other spondylosis, lumbar region: Secondary | ICD-10-CM | POA: Diagnosis not present

## 2016-02-13 DIAGNOSIS — M170 Bilateral primary osteoarthritis of knee: Secondary | ICD-10-CM | POA: Diagnosis not present

## 2016-02-13 DIAGNOSIS — M545 Low back pain: Secondary | ICD-10-CM | POA: Diagnosis present

## 2016-02-13 DIAGNOSIS — M5116 Intervertebral disc disorders with radiculopathy, lumbar region: Secondary | ICD-10-CM | POA: Insufficient documentation

## 2016-02-13 DIAGNOSIS — M533 Sacrococcygeal disorders, not elsewhere classified: Secondary | ICD-10-CM | POA: Diagnosis not present

## 2016-02-13 DIAGNOSIS — M5416 Radiculopathy, lumbar region: Secondary | ICD-10-CM

## 2016-02-13 DIAGNOSIS — M5136 Other intervertebral disc degeneration, lumbar region: Secondary | ICD-10-CM

## 2016-02-13 DIAGNOSIS — M47816 Spondylosis without myelopathy or radiculopathy, lumbar region: Secondary | ICD-10-CM

## 2016-02-13 DIAGNOSIS — M79606 Pain in leg, unspecified: Secondary | ICD-10-CM | POA: Diagnosis present

## 2016-02-13 MED ORDER — GABAPENTIN 300 MG PO CAPS: ORAL_CAPSULE | ORAL | Status: DC

## 2016-02-13 MED ORDER — OXYCODONE HCL 10 MG PO TABS: ORAL_TABLET | ORAL | Status: DC

## 2016-02-13 NOTE — Patient Instructions (Addendum)
  PLAN   Continue present medications  Neurontin and oxycodone  F/U PCP [name] for evaliation of  BP and for evaluation of general medical condition   GI evaluation at [facility] as discussed  F/U surgical evaluation. May consider pending follow-up evaluations. Follow-up evaluation of knee as discussed   F/U neurological evaluation. May consider PNCV/EMG studies pending follow-up evaluations  May consider radiofrequency rhizolysis or intraspinal procedures pending response to present treatment and F/U evaluation   Patient to call Pain Management Center should patient have concerns prior to scheduled return appointment

## 2016-02-13 NOTE — Progress Notes (Signed)
Safety precautions to be maintained throughout the outpatient stay will include: orient to surroundings, keep bed in low position, maintain call bell within reach at all times, provide assistance with transfer out of bed and ambulation.  

## 2016-02-13 NOTE — Progress Notes (Signed)
Subjective:    Patient ID: Katrina Reynolds, female    DOB: 1976/10/04, 40 y.o.   MRN: [id number]  HPI  The patient is a 40 year old female who returns to pain management for further evaluation and treatment of pain involving the lower back and lower extremity regions predominantly the patient is undergone evaluation of GI condition and will continue under the care of of her primary care physician Katrina Reynolds and [facility] gastroenterologist. We discussed patient's condition on today's visit and informed patient that we preferred to avoid interventional treatment. The patient is lower back lower extremity pain as well as pain involving the region of the knees. The patient stated that she did undergo injection of the knee. The patient continues to have pain involving the region of the knees. We will consider patient for geniculate nerve blocks of the knee pending response to treatment and follow-up evaluation as well as consider patient for additional interventional treatment involving the lumbar and lower extremity regions. The patient states that she has diagnoses of proctitis at this time. We will continue medications as prescribed and patient is to call pain management prior to scheduled return appointment should there be change in condition or should patient have other concerns regarding condition prior to scheduled return appointment. All were in agreement with suggested treatment plan.  Review of Systems     Objective:   Physical Exam  There was tenderness to palpation of the splenius capitis and occipitalis musculature region of mild degree. There was mild tenderness of the cervical facet cervical paraspinal muscular region. Palpation of the acromial clavicular and glenohumeral joint regions reproduce mild discomfort as well. The patient appeared to be with bilaterally equal grip strength and Tinel and Phalen's maneuver were without increased pain of significant degree. Palpation over  the thoracic region thoracic facet region was attends to palpation with no crepitus of the thoracic region noted. Palpation of the thoracic region was mild to moderate degree in the lower thoracic region. Palpation over the lumbar paraspinal must reason lumbar facet region was with moderate tenderness to palpation with lateral bending rotation extension and palpation of the lumbar facets reproducing moderate discomfort. Palpation over the PSIS and PII S regions reproduces moderate discomfort as well as mild tenderness of the greater trochanteric region and iliotibial band region. Mild difficulty with patient attempted to stand on tiptoes and heels noted. Straight leg raising was tolerated to approximately 30 without increase of pain with dorsiflexion noted. There appeared to be negative clonus negative Homans. Abdomen was with mild tenderness to palpation with no costovertebral tenderness noted    Assessment & Plan:     Degenerative disc disease of the lumbar spine L3-4 facet joint degenerative changes. L4-L5 facet joint degenerative changes.  Lumbar facet syndrome  Lumbar radiculopathy  Sacroiliac joint dysfunction  Degenerative disc disease lumbar spine   Degenerative joint disease of knees    PLAN   Continue present medications  Neurontin and oxycodone  F/U PCP Katrina Reynolds for evaliation of  BP GI condition and for evaluation of general medical condition   GI evaluation at [facility] . Patient will undergo follow-up evaluation as discussed  F/U surgical evaluation. May consider pending follow-up evaluations. Follow-up evaluation of knee as discussed . Patient had injection of knee performed by orthopedic surgeon.  F/U neurological evaluation. May consider PNCV/EMG studies pending follow-up evaluations  May consider radiofrequency rhizolysis or intraspinal procedures pending response to present treatment and F/U evaluation   Patient to  call Pain  Management Center should patient have concerns prior to scheduled return appointment

## 2016-02-26 ENCOUNTER — Ambulatory Visit: Admission: RE | Admit: 2016-02-26 | Payer: BLUE CROSS/BLUE SHIELD | Source: Ambulatory Visit

## 2016-03-13 ENCOUNTER — Encounter: Payer: Self-pay | Admitting: Pain Medicine

## 2016-03-13 ENCOUNTER — Ambulatory Visit: Payer: BLUE CROSS/BLUE SHIELD | Attending: Pain Medicine | Admitting: Pain Medicine

## 2016-03-13 VITALS — BP 100/66 | HR 80 | Temp 98.2°F | Resp 16 | Ht 64.0 in | Wt 163.0 lb

## 2016-03-13 DIAGNOSIS — M533 Sacrococcygeal disorders, not elsewhere classified: Secondary | ICD-10-CM | POA: Diagnosis not present

## 2016-03-13 DIAGNOSIS — M170 Bilateral primary osteoarthritis of knee: Secondary | ICD-10-CM

## 2016-03-13 DIAGNOSIS — M47816 Spondylosis without myelopathy or radiculopathy, lumbar region: Secondary | ICD-10-CM

## 2016-03-13 DIAGNOSIS — M47896 Other spondylosis, lumbar region: Secondary | ICD-10-CM | POA: Insufficient documentation

## 2016-03-13 DIAGNOSIS — M5116 Intervertebral disc disorders with radiculopathy, lumbar region: Secondary | ICD-10-CM | POA: Diagnosis not present

## 2016-03-13 DIAGNOSIS — M545 Low back pain: Secondary | ICD-10-CM | POA: Diagnosis present

## 2016-03-13 DIAGNOSIS — M79606 Pain in leg, unspecified: Secondary | ICD-10-CM | POA: Diagnosis present

## 2016-03-13 DIAGNOSIS — M5136 Other intervertebral disc degeneration, lumbar region: Secondary | ICD-10-CM

## 2016-03-13 DIAGNOSIS — M5416 Radiculopathy, lumbar region: Secondary | ICD-10-CM

## 2016-03-13 MED ORDER — OXYCODONE HCL 10 MG PO TABS: ORAL_TABLET | ORAL | Status: DC

## 2016-03-13 MED ORDER — GABAPENTIN 300 MG PO CAPS: ORAL_CAPSULE | ORAL | Status: DC

## 2016-03-13 NOTE — Progress Notes (Signed)
Subjective:    Patient ID: Katrina Reynolds, female    DOB: August 21, 1976, 40 y.o.   MRN: [id number]  HPI  The patient is a 40 year old female who returns to pain management for further evaluation and treatment of pain involving the lower back and lower extremity region. The patient has had GI evaluation and we'll continue under the care of of her primary care physician as well as gastroenterologist in [place]. We discussed patient's condition and patient continues to have significant lower back and lower extremity pain which becomes more intense as the day progresses and also interferes of patient's ability to obtain restful sleep. The patient is undergone physical therapy without benefit in terms of decreasing his severe pain of the lumbar and lower extremity region. We have discussed interventional treatment and there is concern regarding significant component of patient's pain being due to lumbar facet syndrome. We will proceed with lumbar facet, medial branch nerve, blocks at time of return appointment. We will perform procedure with local anesthetic bupivacaine and will avoid the use of steroids to avoid aggravation of patient's gastroenterological condition. We will also request insurance approval for radiofrequency rhizolysis lumbar facet, medial branch nerves. The patient will continue Neurontin oxycodone as prescribed this time and we will schedule patient for lumbar facet, medial branch nerve, blocks to be performed with local anesthetic at time of return appointment. We will request insurance approval for lumbar facet medial branch nerve blocks to be performed for patient who has significant gastroenterological condition and is unable to receive steroid injection; therefore, the patient is an excellent candidate for radiofrequency rhizolysis lumbar facets medial branch nerves which can be performed without the use of steroid and will provide patient with longer lasting relief of her severely  incapacitating lumbar and lower extremity pain and paresthesias.   Review of Systems     Objective:   Physical Exam  There was tenderness to palpation of the splenius capitis and occipitalis regions of moderate degree with moderate tenderness over the cervical facet cervical paraspinal musculature region. Palpation over the thoracic facet thoracic paraspinal musculature region was attends to palpation of moderate degree in the lower thoracic region. There was tenderness of the acromioclavicular and glenohumeral joint regions. The patient appeared to be with unremarkable Spurling's maneuver. The patient was with bilaterally equal grip strength with Tinel and Phalen's maneuver reproducing minimal discomfort. There was tenderness to palpation over the lumbar facet lumbar paraspinal musculature region of moderate degree to moderately severe degree with lateral bending and rotation extension and palpation of the lumbar facets reproducing moderately severe discomfort. Palpation of the PSIS and PII S region reproduced pain of moderate to moderately severe degree. Straight leg raising was tolerates approximately 30 without a definite increase of pain with dorsiflexion noted. No definite sensory deficit or dermatomal distribution was detected. Palpation of the greater trochanteric region iliotibial band region was without increased pain of significant degree. DTRs were difficult to elicit patient had difficulty relaxing there was negative clonus negative Homans lateral bending rotation extension and palpation of the lumbar facets reproduce predominant portion of patient's pain abdomen was nontender and no costovertebral tenderness was noted      Assessment & Plan:      Degenerative disc disease of the lumbar spine L3-4 facet joint degenerative changes. L4-L5 facet joint degenerative changes.  Lumbar facet syndrome  Lumbar radiculopathy  Sacroiliac joint dysfunction  Degenerative disc disease  lumbar spine   Degenerative joint disease of knees       PLAN  Continue present medications  Neurontin and oxycodone  Lumbar facet, medial branch nerve, blocks to be performed at time of return appointment  F/U PCP Katrina Reynolds for evaliation of  BP and for evaluation of general medical condition   GI evaluation at [facility] as discussed  F/U surgical evaluation. May consider pending follow-up evaluations. Follow-up evaluation of knee as discussed   F/U neurological evaluation. May consider PNCV/EMG studies pending follow-up evaluations  May consider radiofrequency rhizolysis or intraspinal procedures pending response to present treatment and F/U evaluation . Please ask Katrina Reynolds  and Katrina Reynolds to let you know as soon as you are approved for radiofrequency rhizolysis lumbar facets on the left side at L5, L4, and L3  Patient to call Pain Management Center should patient have concerns prior to scheduled return appointment

## 2016-03-13 NOTE — Patient Instructions (Addendum)
PLAN   Continue present medications  Neurontin and oxycodone  Lumbar facet, medial branch nerve, blocks to be performed at time of return appointment  F/U PCP [name] for evaliation of  BP and for evaluation of general medical condition   GI evaluation at [facility] as discussed  F/U surgical evaluation. May consider pending follow-up evaluations. Follow-up evaluation of knee as discussed   F/U neurological evaluation. May consider PNCV/EMG studies pending follow-up evaluations  May consider radiofrequency rhizolysis or intraspinal procedures pending response to present treatment and F/U evaluation . Please ask [name]  and [name] to let you know as soon as you are approved for radiofrequency rhizolysis lumbar facets  Patient to call Pain Management Center should patient have concerns prior to scheduled return appointment Facet Blocks Patient Information  Description: The facets are joints in the spine between the vertebrae.  Like any joints in the body, facets can become irritated and painful.  Arthritis can also effect the facets.  By injecting steroids and local anesthetic in and around these joints, we can temporarily block the nerve supply to them.  Steroids act directly on irritated nerves and tissues to reduce selling and inflammation which often leads to decreased pain.  Facet blocks may be done anywhere along the spine from the neck to the low back depending upon the location of your pain.   After numbing the skin with local anesthetic (like Novocaine), a small needle is passed onto the facet joints under x-ray guidance.  You may experience a sensation of pressure while this is being done.  The entire block usually lasts about 15-25 minutes.   Conditions which may be treated by facet blocks:   Low back/buttock pain  Neck/shoulder pain  Certain types of headaches  Preparation for the injection:  1. Do not eat any solid food or dairy products  within 8 hours of your appointment. 2. You may drink clear liquid up to 3 hours before appointment.  Clear liquids include water, black coffee, juice or soda.  No milk or cream please. 3. You may take your regular medication, including pain medications, with a sip of water before your appointment.  Diabetics should hold regular insulin (if taken separately) and take 1/2 normal NPH dose the morning of the procedure.  Carry some sugar containing items with you to your appointment. 4. A driver must accompany you and be prepared to drive you home after your procedure. 5. Bring all your current medications with you. 6. An IV may be inserted and sedation may be given at the discretion of the physician. 7. A blood pressure cuff, EKG and other monitors will often be applied during the procedure.  Some patients may need to have extra oxygen administered for a short period. 8. You will be asked to provide medical information, including your allergies and medications, prior to the procedure.  We must know immediately if you are taking blood thinners (like Coumadin/Warfarin) or if you are allergic to IV iodine contrast (dye).  We must know if you could possible be pregnant.  Possible side-effects:   Bleeding from needle site  Infection (rare, may require surgery)  Nerve injury (rare)  Numbness & tingling (temporary)  Difficulty urinating (rare, temporary)  Spinal headache (a headache worse with upright posture)  Light-headedness (temporary)  Pain at injection site (serveral days)  Decreased blood pressure (rare, temporary)  Weakness in arm/leg (temporary)  Pressure sensation in back/neck (temporary)   Call if you experience:   Fever/chills  associated with headache or increased back/neck pain  Headache worsened by an upright position  New onset, weakness or numbness of an extremity below the injection site  Hives or difficulty breathing (go to the emergency room)  Inflammation or  drainage at the injection site(s)  Severe back/neck pain greater than usual  New symptoms which are concerning to you  Please note:  Although the local anesthetic injected can often make your back or neck feel good for several hours after the injection, the pain will likely return. It takes 3-7 days for steroids to work.  You may not notice any pain relief for at least one week.  If effective, we will often do a series of 2-3 injections spaced 3-6 weeks apart to maximally decrease your pain.  After the initial series, you may be a candidate for a more permanent nerve block of the facets.  If you have any questions, please call #[phone]) [facility]

## 2016-03-13 NOTE — Progress Notes (Signed)
Safety precautions to be maintained throughout the outpatient stay will include: orient to surroundings, keep bed in low position, maintain call bell within reach at all times, provide assistance with transfer out of bed and ambulation.  

## 2016-03-17 ENCOUNTER — Encounter: Payer: Self-pay | Admitting: Pain Medicine

## 2016-03-17 ENCOUNTER — Other Ambulatory Visit: Payer: Self-pay | Admitting: Pain Medicine

## 2016-03-17 ENCOUNTER — Ambulatory Visit: Payer: BLUE CROSS/BLUE SHIELD | Attending: Pain Medicine | Admitting: Pain Medicine

## 2016-03-17 VITALS — BP 98/78 | HR 73 | Temp 97.6°F | Resp 19 | Ht 64.0 in | Wt 163.0 lb

## 2016-03-17 DIAGNOSIS — M533 Sacrococcygeal disorders, not elsewhere classified: Secondary | ICD-10-CM

## 2016-03-17 DIAGNOSIS — M545 Low back pain: Secondary | ICD-10-CM | POA: Diagnosis present

## 2016-03-17 DIAGNOSIS — M47816 Spondylosis without myelopathy or radiculopathy, lumbar region: Secondary | ICD-10-CM

## 2016-03-17 DIAGNOSIS — M5416 Radiculopathy, lumbar region: Secondary | ICD-10-CM

## 2016-03-17 DIAGNOSIS — M5136 Other intervertebral disc degeneration, lumbar region: Secondary | ICD-10-CM | POA: Diagnosis not present

## 2016-03-17 DIAGNOSIS — M79606 Pain in leg, unspecified: Secondary | ICD-10-CM | POA: Diagnosis present

## 2016-03-17 DIAGNOSIS — M170 Bilateral primary osteoarthritis of knee: Secondary | ICD-10-CM

## 2016-03-17 MED ORDER — FENTANYL CITRATE (PF) 100 MCG/2ML IJ SOLN: 100.0000 ug | Freq: Once | INTRAMUSCULAR | Status: DC

## 2016-03-17 MED ORDER — LACTATED RINGERS IV SOLN: 1000.0000 mL | INTRAVENOUS | Status: DC

## 2016-03-17 MED ORDER — FENTANYL CITRATE (PF) 100 MCG/2ML IJ SOLN
INTRAMUSCULAR | Status: AC
  Administered 2016-03-17: 100 ug via INTRAVENOUS
  Filled 2016-03-17: qty 2

## 2016-03-17 MED ORDER — MIDAZOLAM HCL 5 MG/5ML IJ SOLN: 5.0000 mg | Freq: Once | INTRAMUSCULAR | Status: DC

## 2016-03-17 MED ORDER — BUPIVACAINE HCL (PF) 0.25 % IJ SOLN: 30.0000 mL | Freq: Once | INTRAMUSCULAR | Status: DC

## 2016-03-17 MED ORDER — ORPHENADRINE CITRATE 30 MG/ML IJ SOLN
INTRAMUSCULAR | Status: AC
  Administered 2016-03-17: 12:00:00
  Filled 2016-03-17: qty 2

## 2016-03-17 MED ORDER — ORPHENADRINE CITRATE 30 MG/ML IJ SOLN: 60.0000 mg | Freq: Once | INTRAMUSCULAR | Status: DC

## 2016-03-17 MED ORDER — TRIAMCINOLONE ACETONIDE 40 MG/ML IJ SUSP
INTRAMUSCULAR | Status: AC
  Administered 2016-03-17: 12:00:00
  Filled 2016-03-17: qty 1

## 2016-03-17 MED ORDER — BUPIVACAINE HCL (PF) 0.25 % IJ SOLN
INTRAMUSCULAR | Status: AC
  Administered 2016-03-17: 12:00:00
  Filled 2016-03-17: qty 30

## 2016-03-17 MED ORDER — MIDAZOLAM HCL 5 MG/5ML IJ SOLN
INTRAMUSCULAR | Status: AC
  Administered 2016-03-17: 5 mg via INTRAVENOUS
  Filled 2016-03-17: qty 5

## 2016-03-17 NOTE — Progress Notes (Signed)
Subjective:    Patient ID: Katrina Reynolds, female    DOB: 1976-03-17, 40 y.o.   MRN: [id number]  HPI  PROCEDURE PERFORMED: Lumbar facet (medial branch block)   NOTE: The patient is a 40 y.o. female who returns to [facility] for further evaluation and treatment of pain involving the lumbar and lower extremity region. MRI  revealed the patient to be with evidence of  degenerative disc disease of the lumbar spine L3-4 facet joint degenerative changes. L4-L5 facet joint degenerative changes. The patient appears to be with significant component of pain due to lumbar facet syndrome The risks, benefits, and expectations of the procedure have been discussed and explained to the patient who was understanding and in agreement with suggested treatment plan. We will proceed with interventional treatment as discussed and as explained to the patient who was understanding and wished to proceed with procedure as planned.   DESCRIPTION OF PROCEDURE: Lumbar facet (medial branch block) with IV Versed, IV fentanyl conscious sedation, EKG, blood pressure, pulse, and pulse oximetry monitoring. The procedure was performed with the patient in the prone position. Betadine prep of proposed entry site performed.   NEEDLE PLACEMENT AT: Left L 2 lumbar facet (medial branch block). Under fluoroscopic guidance with oblique orientation of 15 degrees, a 22-gauge needle was inserted at the L 2 vertebral body level with needle placed at the targeted area of Burton's Eye or Eye of the Scotty Dog with documentation of needle placement in the superior and lateral border of targeted area of Burton's Eye or Eye of the Scotty Dog with oblique orientation of 15 degrees. Following documentation of needle placement at the L 2 vertebral body level, needle placement was then accomplished at the L 3 vertebral body level.   NEEDLE PLACEMENT AT L3, L4, and L5 VERTEBRAL BODY LEVELS ON THE LEFT SIDE The procedure was performed at the L3,  L4, and L5 vertebral body levels exactly as was performed at the L 2 vertebral body level utilizing the same technique and under fluoroscopic guidance.  NEEDLE PLACEMENT AT THE SACRAL ALA with AP view of the lumbosacral spine. With the patient in the prone position, Betadine prep of proposed entry site accomplished, a 22 gauge needle was inserted in the region of the sacral ala (groove formed by the superior articulating process of S1 and the sacral wing). Following documentation of needle placement at the sacral ala,  Needle placement was then verified at all levels on lateral view. Following documentation of needle placement at all levels on lateral view and following negative aspiration for heme and CSF, each level was injected with 1 mL of 0.25% bupivacaine.     LUMBAR FACET, MEDIAL BRANCH NERVE, BLOCKS PERFORMED ON THE RIGHT SIDE   The procedure was performed on the right side exactly as was performed on the left side at the same levels and utilizing the same technique under fluoroscopic guidance.     The patient tolerated the procedure well. A total of 40 mg of Kenalog was utilized for the procedure.   PLAN:  1. Medications: The patient will continue presently prescribed medications Neurontin and oxycodone 2. May consider modification of treatment regimen at time of return appointment pending response to treatment rendered on today's visit. 3. The patient is to follow-up with primary care physician [name] for further evaluation of blood pressure and general medical condition status post steroid injection performed on today's visit. 4. Surgical follow-up evaluation. Has been addressed 5. Neurological follow-up evaluation. May consider  PNCV EMG studies and other studies 6. The patient may be candidate for radiofrequency procedures, implantation type procedures, and other treatment pending response to treatment and follow-up evaluation.. At the present time we will request insurance  approval for radiofrequency rhizolysis lumbar facet, medial branch nerves. The patient has also undergone physical therapy without improvement of her pain 7. The patient has been advised to call the Pain Management Center prior to scheduled return appointment should there be significant change in condition or should patient have other concerns regarding condition prior to scheduled return appointment.  The patient is understanding and in agreement with suggested treatment plan.   Review of Systems     Objective:   Physical Exam        Assessment & Plan:

## 2016-03-17 NOTE — Patient Instructions (Addendum)
PLAN   Continue present medications  Neurontin and oxycodone  F/U PCP Katrina Reynolds for evaliation of  BP and for evaluation of general medical condition   GI evaluation at [facility] as discussed  F/U surgical evaluation. May consider pending follow-up evaluations. Follow-up evaluation of knee as discussed   F/U neurological evaluation. May consider PNCV/EMG studies pending follow-up evaluations  May consider radiofrequency rhizolysis or intraspinal procedures pending response to present treatment and F/U evaluation . Please ask Katrina Reynolds  and Katrina Reynolds to let you know as soon as you are approved for radiofrequency rhizolysis lumbar facets as we previously discussed  Patient to call [facility] should patient have concerns prior to scheduled return appointmentGENERAL RISKS AND COMPLICATIONS  What are the risk, side effects and possible complications? Generally speaking, most procedures are safe.  However, with any procedure there are risks, side effects, and the possibility of complications.  The risks and complications are dependent upon the sites that are lesioned, or the type of nerve block to be performed.  The closer the procedure is to the spine, the more serious the risks are.  Great care is taken when placing the radio frequency needles, block needles or lesioning probes, but sometimes complications can occur. 1. Infection: Any time there is an injection through the skin, there is a risk of infection.  This is why sterile conditions are used for these blocks.  There are four possible types of infection. 1. Localized skin infection. 2. Central Nervous System Infection-This can be in the form of Meningitis, which can be deadly. 3. Epidural Infections-This can be in the form of an epidural abscess, which can cause pressure inside of the spine, causing compression of the spinal cord with subsequent paralysis. This would require an emergency surgery to decompress,  and there are no guarantees that the patient would recover from the paralysis. 4. Discitis-This is an infection of the intervertebral discs.  It occurs in about 1% of discography procedures.  It is difficult to treat and it may lead to surgery.        2. Pain: the needles have to go through skin and soft tissues, will cause soreness.       3. Damage to internal structures:  The nerves to be lesioned may be near blood vessels or    other nerves which can be potentially damaged.       4. Bleeding: Bleeding is more common if the patient is taking blood thinners such as  aspirin, Coumadin, Ticiid, Plavix, etc., or if he/she have some genetic predisposition  such as hemophilia. Bleeding into the spinal canal can cause compression of the spinal  cord with subsequent paralysis.  This would require an emergency surgery to  decompress and there are no guarantees that the patient would recover from the  paralysis.       5. Pneumothorax:  Puncturing of a lung is a possibility, every time a needle is introduced in  the area of the chest or upper back.  Pneumothorax refers to free air around the  collapsed lung(s), inside of the thoracic cavity (chest cavity).  Another two possible  complications related to a similar event would include: Hemothorax and Chylothorax.   These are variations of the Pneumothorax, where instead of air around the collapsed  lung(s), you may have blood or chyle, respectively.       6. Spinal headaches: They may occur with any procedures in the area of the spine.  7. Persistent CSF (Cerebro-Spinal Fluid) leakage: This is a rare problem, but may occur  with prolonged intrathecal or epidural catheters either due to the formation of a fistulous  track or a dural tear.       8. Nerve damage: By working so close to the spinal cord, there is always a possibility of  nerve damage, which could be as serious as a permanent spinal cord injury with  paralysis.       9. Death:  Although rare, severe  deadly allergic reactions known as "Anaphylactic  reaction" can occur to any of the medications used.      10. Worsening of the symptoms:  We can always make thing worse.  What are the chances of something like this happening? Chances of any of this occuring are extremely low.  By statistics, you have more of a chance of getting killed in a motor vehicle accident: while driving to the hospital than any of the above occurring .  Nevertheless, you should be aware that they are possibilities.  In general, it is similar to taking a shower.  Everybody knows that you can slip, hit your head and get killed.  Does that mean that you should not shower again?  Nevertheless always keep in mind that statistics do not mean anything if you happen to be on the wrong side of them.  Even if a procedure has a 1 (one) in a 1,000,000 (million) chance of going wrong, it you happen to be that one..Also, keep in mind that by statistics, you have more of a chance of having something go wrong when taking medications.  Who should not have this procedure? If you are on a blood thinning medication (e.g. Coumadin, Plavix, see list of "Blood Thinners"), or if you have an active infection going on, you should not have the procedure.  If you are taking any blood thinners, please inform your physician.  How should I prepare for this procedure?  Do not eat or drink anything at least six hours prior to the procedure.  Bring a driver with you .  It cannot be a taxi.  Come accompanied by an adult that can drive you back, and that is strong enough to help you if your legs get weak or numb from the local anesthetic.  Take all of your medicines the morning of the procedure with just enough water to swallow them.  If you have diabetes, make sure that you are scheduled to have your procedure done first thing in the morning, whenever possible.  If you have diabetes, take only half of your insulin dose and notify our nurse that you have  done so as soon as you arrive at the clinic.  If you are diabetic, but only take blood sugar pills (oral hypoglycemic), then do not take them on the morning of your procedure.  You may take them after you have had the procedure.  Do not take aspirin or any aspirin-containing medications, at least eleven (11) days prior to the procedure.  They may prolong bleeding.  Wear loose fitting clothing that may be easy to take off and that you would not mind if it got stained with Betadine or blood.  Do not wear any jewelry or perfume  Remove any nail coloring.  It will interfere with some of our monitoring equipment.  NOTE: Remember that this is not meant to be interpreted as a complete list of all possible complications.  Unforeseen problems may occur.  BLOOD THINNERS  The following drugs contain aspirin or other products, which can cause increased bleeding during surgery and should not be taken for 2 weeks prior to and 1 week after surgery.  If you should need take something for relief of minor pain, you may take acetaminophen which is found in Tylenol,m Datril, Anacin-3 and Panadol. It is not blood thinner. The products listed below are.  Do not take any of the products listed below in addition to any listed on your instruction sheet.  A.P.C or A.P.C with Codeine Codeine Phosphate Capsules #3 Ibuprofen Ridaura  ABC compound Congesprin Imuran rimadil  Advil Cope Indocin Robaxisal  Alka-Seltzer Effervescent Pain Reliever and Antacid Coricidin or Coricidin-D  Indomethacin Rufen  Alka-Seltzer plus Cold Medicine Cosprin Ketoprofen S-A-C Tablets  Anacin Analgesic Tablets or Capsules Coumadin Korlgesic Salflex  Anacin Extra Strength Analgesic tablets or capsules CP-2 Tablets Lanoril Salicylate  Anaprox Cuprimine Capsules Levenox Salocol  Anexsia-D Dalteparin Magan Salsalate  Anodynos Darvon compound Magnesium Salicylate Sine-off  Ansaid Dasin Capsules Magsal Sodium Salicylate  Anturane Depen Capsules  Marnal Soma  APF Arthritis pain formula Dewitt's Pills Measurin Stanback  Argesic Dia-Gesic Meclofenamic Sulfinpyrazone  Arthritis Bayer Timed Release Aspirin Diclofenac Meclomen Sulindac  Arthritis pain formula Anacin Dicumarol Medipren Supac  Analgesic (Safety coated) Arthralgen Diffunasal Mefanamic Suprofen  Arthritis Strength Bufferin Dihydrocodeine Mepro Compound Suprol  Arthropan liquid Dopirydamole Methcarbomol with Aspirin Synalgos  ASA tablets/Enseals Disalcid Micrainin Tagament  Ascriptin Doan's Midol Talwin  Ascriptin A/D Dolene Mobidin Tanderil  Ascriptin Extra Strength Dolobid Moblgesic Ticlid  Ascriptin with Codeine Doloprin or Doloprin with Codeine Momentum Tolectin  Asperbuf Duoprin Mono-gesic Trendar  Aspergum Duradyne Motrin or Motrin IB Triminicin  Aspirin plain, buffered or enteric coated Durasal Myochrisine Trigesic  Aspirin Suppositories Easprin Nalfon Trillsate  Aspirin with Codeine Ecotrin Regular or Extra Strength Naprosyn Uracel  Atromid-S Efficin Naproxen Ursinus  Auranofin Capsules Elmiron Neocylate Vanquish  Axotal Emagrin Norgesic Verin  Azathioprine Empirin or Empirin with Codeine Normiflo Vitamin E  Azolid Emprazil Nuprin Voltaren  Bayer Aspirin plain, buffered or children's or timed BC Tablets or powders Encaprin Orgaran Warfarin Sodium  Buff-a-Comp Enoxaparin Orudis Zorpin  Buff-a-Comp with Codeine Equegesic Os-Cal-Gesic   Buffaprin Excedrin plain, buffered or Extra Strength Oxalid   Bufferin Arthritis Strength Feldene Oxphenbutazone   Bufferin plain or Extra Strength Feldene Capsules Oxycodone with Aspirin   Bufferin with Codeine Fenoprofen Fenoprofen Pabalate or Pabalate-SF   Buffets II Flogesic Panagesic   Buffinol plain or Extra Strength Florinal or Florinal with Codeine Panwarfarin   Buf-Tabs Flurbiprofen Penicillamine   Butalbital Compound Four-way cold tablets Penicillin   Butazolidin Fragmin Pepto-Bismol   Carbenicillin Geminisyn Percodan    Carna Arthritis Reliever Geopen Persantine   Carprofen Gold's salt Persistin   Chloramphenicol Goody's Phenylbutazone   Chloromycetin Haltrain Piroxlcam   Clmetidine heparin Plaquenil   Cllnoril Hyco-pap Ponstel   Clofibrate Hydroxy chloroquine Propoxyphen         Before stopping any of these medications, be sure to consult the physician who ordered them.  Some, such as Coumadin (Warfarin) are ordered to prevent or treat serious conditions such as "deep thrombosis", "pumonary embolisms", and other heart problems.  The amount of time that you may need off of the medication may also vary with the medication and the reason for which you were taking it.  If you are taking any of these medications, please make sure you notify your pain physician before you undergo any procedures.

## 2016-03-17 NOTE — Progress Notes (Signed)
Safety precautions to be maintained throughout the outpatient stay will include: orient to surroundings, keep bed in low position, maintain call bell within reach at all times, provide assistance with transfer out of bed and ambulation.  

## 2016-03-18 ENCOUNTER — Telehealth: Payer: Self-pay | Admitting: *Deleted

## 2016-03-18 NOTE — Telephone Encounter (Signed)
Left voicemail to call our office if there are questions or concerns re; procedure on yesterday.

## 2016-03-27 ENCOUNTER — Other Ambulatory Visit: Payer: Self-pay | Admitting: Pain Medicine

## 2016-03-27 DIAGNOSIS — M533 Sacrococcygeal disorders, not elsewhere classified: Secondary | ICD-10-CM

## 2016-03-27 DIAGNOSIS — M5136 Other intervertebral disc degeneration, lumbar region: Secondary | ICD-10-CM

## 2016-03-27 DIAGNOSIS — M170 Bilateral primary osteoarthritis of knee: Secondary | ICD-10-CM

## 2016-03-27 DIAGNOSIS — M5416 Radiculopathy, lumbar region: Secondary | ICD-10-CM

## 2016-03-27 DIAGNOSIS — M47816 Spondylosis without myelopathy or radiculopathy, lumbar region: Secondary | ICD-10-CM

## 2016-04-10 ENCOUNTER — Ambulatory Visit: Payer: BLUE CROSS/BLUE SHIELD | Attending: Pain Medicine | Admitting: Pain Medicine

## 2016-04-10 ENCOUNTER — Encounter: Payer: Self-pay | Admitting: Pain Medicine

## 2016-04-10 VITALS — BP 95/72 | HR 88 | Temp 98.2°F | Resp 16 | Ht 64.0 in | Wt 164.0 lb

## 2016-04-10 DIAGNOSIS — M533 Sacrococcygeal disorders, not elsewhere classified: Secondary | ICD-10-CM | POA: Diagnosis not present

## 2016-04-10 DIAGNOSIS — M5116 Intervertebral disc disorders with radiculopathy, lumbar region: Secondary | ICD-10-CM | POA: Diagnosis not present

## 2016-04-10 DIAGNOSIS — M7060 Trochanteric bursitis, unspecified hip: Secondary | ICD-10-CM

## 2016-04-10 DIAGNOSIS — M47896 Other spondylosis, lumbar region: Secondary | ICD-10-CM | POA: Insufficient documentation

## 2016-04-10 DIAGNOSIS — M5416 Radiculopathy, lumbar region: Secondary | ICD-10-CM

## 2016-04-10 DIAGNOSIS — M47816 Spondylosis without myelopathy or radiculopathy, lumbar region: Secondary | ICD-10-CM

## 2016-04-10 DIAGNOSIS — M170 Bilateral primary osteoarthritis of knee: Secondary | ICD-10-CM | POA: Diagnosis not present

## 2016-04-10 DIAGNOSIS — M79606 Pain in leg, unspecified: Secondary | ICD-10-CM | POA: Diagnosis present

## 2016-04-10 DIAGNOSIS — M5136 Other intervertebral disc degeneration, lumbar region: Secondary | ICD-10-CM

## 2016-04-10 DIAGNOSIS — M545 Low back pain: Secondary | ICD-10-CM | POA: Diagnosis present

## 2016-04-10 MED ORDER — GABAPENTIN 300 MG PO CAPS
ORAL_CAPSULE | ORAL | Status: DC
Start: 2016-04-10 — End: 2016-05-08

## 2016-04-10 MED ORDER — OXYCODONE HCL 10 MG PO TABS: ORAL_TABLET | ORAL | Status: DC

## 2016-04-10 NOTE — Progress Notes (Signed)
   Subjective:    Patient ID: Katrina Reynolds, female    DOB: 08/30/1976, 40 y.o.   MRN: [id number]  HPI  The patient is a 40 year old female who returns to pain management for further evaluation and treatment of pain involving the lower back and lower extremity region predominantly. The patient states that the pain of the lower back region is aggravated by standing walking twisting turning maneuvers. The patient denies any trauma change in events of daily living because change in symptomatology. The patient states that she wishes to proceed with radiofrequency rhizolysis lumbar facet, medial branch nerves at time return appointment. We will consider additional modifications of treatment pending follow-up evaluation. We will request insurance approval for radiofrequency rhizolysis lumbar facets L3-L4 and L5. The patient agreed to suggested treatment plan and we will continue Neurontin and oxycodone as prescribed at this time. All agreed to suggested treatment plan  Review of Systems     Objective:   Physical Exam  There was tenderness to palpation of paraspinal musculature region cervical region cervical facet region a moderate degree. Palpation of the splenius capitis and occipitalis regions reproduce moderate discomfort as well. Palpation over the thoracic paraspinal musculature and thoracic facet region was attends to palpation of moderate degree with moderate muscle spasms noted in the lower thoracic paraspinal musculature region. There was no crepitus of the thoracic region noted. The patient appeared to be with bilaterally equal grip strength with Tinel and Phalen's maneuver reproducing minimal discomfort there was tenderness of the acromioclavicular and glenohumeral joint regions with unremarkable Spurling's maneuver. Palpation over the lumbar paraspinal must reason lumbar facet region was with moderate severe discomfort lateral bending rotation extension and palpation of the lumbar facets  reproduced moderately discomfort. There was tenderness over the region of the PSIS and PI is region a moderate degree and mild tenderness of the greater trochanteric region iliotibial band region. Straight leg raise was tolerates approximately 30 without an increase of pain with dorsiflexion noted. EHL strength appeared to be decreased. Knees were attends palpation of mild to moderate degree with the palpation of the lumbar paraspinal misreading lumbar facet region reproduced mild portion of patient's pain. There was no clonus negative Homans. Abdomen nontender with no costovertebral tenderness noted      Assessment & Plan:     Degenerative disc disease of the lumbar spine L3-4 facet joint degenerative changes. L4-L5 facet joint degenerative changes.  Lumbar facet syndrome  Lumbar radiculopathy  Sacroiliac joint dysfunction  Degenerative disc disease lumbar spine   Degenerative joint disease of knees      PLAN   Continue present medications  Neurontin and oxycodone  F/U PCP [name] for evaliation of  BP and for evaluation of general medical condition . Please follow-up with [name] regarding low blood pressure and GI condition as discussed   GI evaluation at [facility] as discussed  F/U surgical evaluation. May consider pending follow-up evaluations. Follow-up evaluation of knee as discussed   F/U neurological evaluation. May consider PNCV/EMG studies pending follow-up evaluations  May consider radiofrequency rhizolysis or intraspinal procedures pending response to present treatment and F/U evaluation . Please ask [name]  and [name] to let you know as soon as you are approved for radiofrequency rhizolysis lumbar facets as we previously discussed  Patient to call [facility] should patient have concerns prior to scheduled return appointment

## 2016-04-10 NOTE — Progress Notes (Signed)
Safety precautions to be maintained throughout the outpatient stay will include: orient to surroundings, keep bed in low position, maintain call bell within reach at all times, provide assistance with transfer out of bed and ambulation.  

## 2016-04-10 NOTE — Patient Instructions (Addendum)
PLAN   Continue present medications  Neurontin and oxycodone  F/U PCP [name] for evaliation of  BP and for evaluation of general medical condition . Please follow-up with [name] regarding low blood pressure and GI condition as discussed   GI evaluation at [facility] as discussed  F/U surgical evaluation. May consider pending follow-up evaluations. Follow-up evaluation of knee as discussed   F/U neurological evaluation. May consider PNCV/EMG studies pending follow-up evaluations  May consider radiofrequency rhizolysis or intraspinal procedures pending response to present treatment and F/U evaluation . Please ask [name]  and [name] to let you know as soon as you are approved for radiofrequency rhizolysis lumbar facets as we previously discussed  Patient to call [facility] should patient have concerns prior to scheduled return appointmentSteps to Quit Smoking  Smoking tobacco can be harmful to your health and can affect almost every organ in your body. Smoking puts you, and those around you, at risk for developing many serious chronic diseases. Quitting smoking is difficult, but it is one of the best things that you can do for your health. It is never too late to quit. WHAT ARE THE BENEFITS OF QUITTING SMOKING? When you quit smoking, you lower your risk of developing serious diseases and conditions, such as:  Lung cancer or lung disease, such as COPD.  Heart disease.  Stroke.  Heart attack.  Infertility.  Osteoporosis and bone fractures. Additionally, symptoms such as coughing, wheezing, and shortness of breath may get better when you quit. You may also find that you get sick less often because your body is stronger at fighting off colds and infections. If you are pregnant, quitting smoking can help to reduce your chances of having a baby of low birth weight. HOW DO I GET READY TO QUIT? When you decide to quit smoking, create a plan to make  sure that you are successful. Before you quit:  Pick a date to quit. Set a date within the next two weeks to give you time to prepare.  Write down the reasons why you are quitting. Keep this list in places where you will see it often, such as on your bathroom mirror or in your car or wallet.  Identify the people, places, things, and activities that make you want to smoke (triggers) and avoid them. Make sure to take these actions:  Throw away all cigarettes at home, at work, and in your car.  Throw away smoking accessories, such as Scientist, research (medical).  Clean your car and make sure to empty the ashtray.  Clean your home, including curtains and carpets.  Tell your family, friends, and coworkers that you are quitting. Support from your loved ones can make quitting easier.  Talk with your health care provider about your options for quitting smoking.  Find out what treatment options are covered by your health insurance. WHAT STRATEGIES CAN I USE TO QUIT SMOKING?  Talk with your healthcare provider about different strategies to quit smoking. Some strategies include:  Quitting smoking altogether instead of gradually lessening how much you smoke over a period of time. Research shows that quitting "cold [place]" is more successful than gradually quitting.  Attending in-person counseling to help you build problem-solving skills. You are more likely to have success in quitting if you attend several counseling sessions. Even short sessions of 10 minutes can be effective.  Finding resources and support systems that can help you to quit smoking and remain smoke-free after you quit.  These resources are most helpful when you use them often. They can include:  Online chats with a Social worker.  Telephone quitlines.  Printed Furniture conservator/restorer.  Support groups or group counseling.  Text messaging programs.  Mobile phone applications.  Taking medicines to help you quit smoking. (If you are  pregnant or breastfeeding, talk with your health care provider first.) Some medicines contain nicotine and some do not. Both types of medicines help with cravings, but the medicines that include nicotine help to relieve withdrawal symptoms. Your health care provider may recommend:  Nicotine patches, gum, or lozenges.  Nicotine inhalers or sprays.  Non-nicotine medicine that is taken by mouth. Talk with your health care provider about combining strategies, such as taking medicines while you are also receiving in-person counseling. Using these two strategies together makes you more likely to succeed in quitting than if you used either strategy on its own. If you are pregnant or breastfeeding, talk with your health care provider about finding counseling or other support strategies to quit smoking. Do not take medicine to help you quit smoking unless told to do so by your health care provider. WHAT THINGS CAN I DO TO MAKE IT EASIER TO QUIT? Quitting smoking might feel overwhelming at first, but there is a lot that you can do to make it easier. Take these important actions:  Reach out to your family and friends and ask that they support and encourage you during this time. Call telephone quitlines, reach out to support groups, or work with a counselor for support.  Ask people who smoke to avoid smoking around you.  Avoid places that trigger you to smoke, such as bars, parties, or smoke-break areas at work.  Spend time around people who do not smoke.  Lessen stress in your life, because stress can be a smoking trigger for some people. To lessen stress, try:  Exercising regularly.  Deep-breathing exercises.  Yoga.  Meditating.  Performing a body scan. This involves closing your eyes, scanning your body from head to toe, and noticing which parts of your body are particularly tense. Purposefully relax the muscles in those areas.  Download or purchase mobile phone or tablet apps (applications)  that can help you stick to your quit plan by providing reminders, tips, and encouragement. There are many free apps, such as QuitGuide from the [employer] Office manager for Disease Control and Prevention). You can find other support for quitting smoking (smoking cessation) through smokefree.gov and other websites. HOW WILL I FEEL WHEN I QUIT SMOKING? Within the first 24 hours of quitting smoking, you may start to feel some withdrawal symptoms. These symptoms are usually most noticeable 2-3 days after quitting, but they usually do not last beyond 2-3 weeks. Changes or symptoms that you might experience include:  Mood swings.  Restlessness, anxiety, or irritation.  Difficulty concentrating.  Dizziness.  Strong cravings for sugary foods in addition to nicotine.  Mild weight gain.  Constipation.  Nausea.  Coughing or a sore throat.  Changes in how your medicines work in your body.  A depressed mood.  Difficulty sleeping (insomnia). After the first 2-3 weeks of quitting, you may start to notice more positive results, such as:  Improved sense of smell and taste.  Decreased coughing and sore throat.  Slower heart rate.  Lower blood pressure.  Clearer skin.  The ability to breathe more easily.  Fewer sick days. Quitting smoking is very challenging for most people. Do not get discouraged if you are not successful the first  time. Some people need to make many attempts to quit before they achieve long-term success. Do your best to stick to your quit plan, and talk with your health care provider if you have any questions or concerns.   This information is not intended to replace advice given to you by your health care provider. Make sure you discuss any questions you have with your health care provider.   Document Released: 11/25/2001 Document Revised: 04/17/2015 Document Reviewed: 04/17/2015 Elsevier Interactive Patient Education 2016 [place].  Radiofrequency Lesioning Radiofrequency  lesioning is a procedure that is performed to relieve pain. The procedure is often used for back, neck, or arm pain. Radiofrequency lesioning involves the use of a machine that creates radio waves to make heat. During the procedure, the heat is applied to the nerve that carries the pain signal. The heat damages the nerve and interferes with the pain signal. Pain relief usually lasts for 6 months to 1 year. LET [facility] CARE PROVIDER KNOW ABOUT:  Any allergies you have.  All medicines you are taking, including vitamins, herbs, eye drops, creams, and over-the-counter medicines.  Previous problems you or members of your family have had with the use of anesthetics.  Any blood disorders you have.  Previous surgeries you have had.  Any medical conditions you have.  Whether you are pregnant or may be pregnant. RISKS AND COMPLICATIONS Generally, this is a safe procedure. However, problems may occur, including:  Pain or soreness at the injection site.  Infection at the injection site.  Damage to nerves or blood vessels. BEFORE THE PROCEDURE  Ask your health care provider about:  Changing or stopping your regular medicines. This is especially important if you are taking diabetes medicines or blood thinners.  Taking medicines such as aspirin and ibuprofen. These medicines can thin your blood. Do not take these medicines before your procedure if your health care provider instructs you not to.  Follow instructions from your health care provider about eating or drinking restrictions.  Plan to have someone take you home after the procedure.  If you go home right after the procedure, plan to have someone with you for 24 hours. PROCEDURE  You will be given one or more of the following:  A medicine to help you relax (sedative).  A medicine to numb the area (local anesthetic).  You will be awake during the procedure. You will need to be able to talk with the health care provider during  the procedure.  With the help of a type of X-ray (fluoroscopy), the health care provider will insert a radiofrequency needle into the area to be treated.  Next, a wire that carries the radio waves (electrode) will be put through the radiofrequency needle. An electrical pulse will be sent through the electrode to verify the correct nerve. You will feel a tingling sensation, and you may have muscle twitching.  Then, the tissue that is around the needle tip will be heated by an electric current that is passed using the radiofrequency machine. This will numb the nerves.  A bandage (dressing) will be put on the insertion area after the procedure is done. The procedure may vary among health care providers and hospitals. AFTER THE PROCEDURE  Your blood pressure, heart rate, breathing rate, and blood oxygen level will be monitored often until the medicines you were given have worn off.  Return to your normal activities as directed by your health care provider.   This information is not intended to replace advice  given to you by your health care provider. Make sure you discuss any questions you have with your health care provider.   Document Released: 07/30/2011 Document Revised: 08/22/2015 Document Reviewed: 01/08/2015 Elsevier Interactive Patient Education [employer].

## 2016-05-08 ENCOUNTER — Ambulatory Visit: Payer: BLUE CROSS/BLUE SHIELD | Attending: Pain Medicine | Admitting: Pain Medicine

## 2016-05-08 ENCOUNTER — Encounter: Payer: Self-pay | Admitting: Pain Medicine

## 2016-05-08 VITALS — BP 103/70 | HR 83 | Temp 98.4°F | Resp 16 | Ht 64.0 in | Wt 160.0 lb

## 2016-05-08 DIAGNOSIS — M7060 Trochanteric bursitis, unspecified hip: Secondary | ICD-10-CM

## 2016-05-08 DIAGNOSIS — M170 Bilateral primary osteoarthritis of knee: Secondary | ICD-10-CM | POA: Diagnosis not present

## 2016-05-08 DIAGNOSIS — M542 Cervicalgia: Secondary | ICD-10-CM | POA: Diagnosis present

## 2016-05-08 DIAGNOSIS — M5116 Intervertebral disc disorders with radiculopathy, lumbar region: Secondary | ICD-10-CM | POA: Insufficient documentation

## 2016-05-08 DIAGNOSIS — R51 Headache: Secondary | ICD-10-CM | POA: Diagnosis present

## 2016-05-08 DIAGNOSIS — M47896 Other spondylosis, lumbar region: Secondary | ICD-10-CM | POA: Insufficient documentation

## 2016-05-08 DIAGNOSIS — M47816 Spondylosis without myelopathy or radiculopathy, lumbar region: Secondary | ICD-10-CM

## 2016-05-08 DIAGNOSIS — M533 Sacrococcygeal disorders, not elsewhere classified: Secondary | ICD-10-CM | POA: Diagnosis not present

## 2016-05-08 DIAGNOSIS — M5416 Radiculopathy, lumbar region: Secondary | ICD-10-CM

## 2016-05-08 DIAGNOSIS — M5136 Other intervertebral disc degeneration, lumbar region: Secondary | ICD-10-CM

## 2016-05-08 MED ORDER — OXYCODONE HCL 10 MG PO TABS: ORAL_TABLET | ORAL | Status: DC

## 2016-05-08 MED ORDER — GABAPENTIN 300 MG PO CAPS: ORAL_CAPSULE | ORAL | Status: DC

## 2016-05-08 NOTE — Progress Notes (Signed)
Safety precautions to be maintained throughout the outpatient stay will include: orient to surroundings, keep bed in low position, maintain call bell within reach at all times, provide assistance with transfer out of bed and ambulation.  

## 2016-05-08 NOTE — Patient Instructions (Addendum)
  PLAN   Continue present medications  Neurontin and oxycodone  F/U PCP [name] for evaliation of  BP and for evaluation of general medical condition . Please follow-up with [name] regarding GI condition as discussed as well  GI evaluation at [facility] as discussed  F/U surgical evaluation. May consider pending follow-up evaluations. Follow-up evaluation of knee as discussed   F/U neurological evaluation. May consider PNCV/EMG studies pending follow-up evaluations  May consider radiofrequency rhizolysis or intraspinal procedures pending response to present treatment and F/U evaluation .   Please ask [name]  and [name] to let you know as soon as you are approved for radiofrequency rhizolysis lumbar facets as we previously discussed  Patient to call [facility] should patient have concerns prior to scheduled return appointment

## 2016-05-09 NOTE — Progress Notes (Signed)
Subjective:    Patient ID: Katrina Reynolds, female    DOB: 13-Oct-1976, 40 y.o.   MRN: [id number]  HPI  The patient is a 40 year old female who returns to pain management for further evaluation and treatment of pain which is involving pain involving the neck associated with headaches as well as lower back and lower extremity pain. The patient appears to be with significant component of pain due to facet syndrome. There has been difficulty obtain insurance approval for radiofrequency rhizolysis lumbar facet, medial branch nerves. The patient continues to work and is with increased pain occurring the lower back lower extremity region aggravated with standing walking twisting turning maneuvers especially. We discussed patient's condition and will continue present medications consisting of Neurontin and oxycodone. The patient states that she has significant headaches with pain occurring the back of the neck radiating to the back of the head the region behind the eyes. We discussed interventional treatment and patient would benefit significantly from bilateral occipital nerve blocks. We will consider patient for such treatment and patient will call pain management should she wishes to proceed with interventional treatment. At the present time we will continue Neurontin and oxycodone and we'll remain available to consider modification of treatment pending follow-up evaluation. All agreed to suggested treatment plan.         Review of Systems         Objective:   Physical Exam   There was tenderness to palpation of the splenius capitis and occipitalis muscles regions palpation which be produced moderate severe discomfort. There was no excessive tends to palpation of the temporal and joint region and no bounding pulsations of the temporal region were noted. There appeared to be no significant tenderness of the sinus regions. Palpation over the cervical facet cervical paraspinal musculature region  reproduced moderately severe discomfort as well. There was mild tenderness of the acromioclavicular and glenohumeral joint region and patient was with unremarkable Spurling's maneuver. Tinel and Phalen's maneuver were without increased pain of significant degree. Palpation over the thoracic region was with no crepitus of the thoracic region noted. Palpation over the lumbar paraspinal must reason lumbar facet region associated with increased pain of moderate to moderate severe degree with extension and palpation of the lumbar facets reproducing severe discomfort. There was tenderness of the PSIS and PII S region a moderate degree and patient appeared to be with mild tenderness to moderate tenderness over the region of the greater trochanteric region iliotibial band region. Straight leg raising was tolerated to 30 without increased pain with dorsiflexion noted. DTRs were difficult to elicit. There was negative clonus negative Homans. Abdomen was with mild tenderness with no costovertebral tenderness noted     Assessment & Plan:     Degenerative disc disease of the lumbar spine L3-4 facet joint degenerative changes. L4-L5 facet joint degenerative changes.  Lumbar facet syndrome  Lumbar radiculopathy  Sacroiliac joint dysfunction  Degenerative disc disease lumbar spine   Degenerative joint disease of knees       PLAN   Continue present medications  Neurontin and oxycodone  F/U PCP [name] for evaliation of  BP and for evaluation of general medical condition . Please follow-up with [name] regarding GI condition as discussed as well  GI evaluation at [facility] as discussed  F/U surgical evaluation. May consider pending follow-up evaluations. Follow-up evaluation of knee as discussed   F/U neurological evaluation. May consider PNCV/EMG studies pending follow-up evaluations  May consider radiofrequency rhizolysis  or intraspinal procedures pending response  to present treatment and F/U evaluation .   Please ask [name]  and [name] to let you know as soon as you are approved for radiofrequency rhizolysis lumbar facets as we previously discussed  Patient to call [facility] Management Center should patient have concerns prior to scheduled return appointment

## 2016-05-16 LAB — TOXASSURE SELECT 13 (MW), URINE

## 2016-05-16 NOTE — Progress Notes (Signed)
Quick Note:  Reviewed. ______ 

## 2016-06-05 ENCOUNTER — Encounter: Payer: Self-pay | Admitting: Pain Medicine

## 2016-06-05 ENCOUNTER — Ambulatory Visit: Payer: BLUE CROSS/BLUE SHIELD | Attending: Pain Medicine | Admitting: Pain Medicine

## 2016-06-05 VITALS — BP 103/82 | HR 83 | Temp 98.1°F | Resp 15 | Ht 64.0 in | Wt 161.0 lb

## 2016-06-05 DIAGNOSIS — R51 Headache: Secondary | ICD-10-CM | POA: Insufficient documentation

## 2016-06-05 DIAGNOSIS — M5116 Intervertebral disc disorders with radiculopathy, lumbar region: Secondary | ICD-10-CM | POA: Diagnosis not present

## 2016-06-05 DIAGNOSIS — M47816 Spondylosis without myelopathy or radiculopathy, lumbar region: Secondary | ICD-10-CM

## 2016-06-05 DIAGNOSIS — M170 Bilateral primary osteoarthritis of knee: Secondary | ICD-10-CM | POA: Diagnosis not present

## 2016-06-05 DIAGNOSIS — M7060 Trochanteric bursitis, unspecified hip: Secondary | ICD-10-CM

## 2016-06-05 DIAGNOSIS — M47896 Other spondylosis, lumbar region: Secondary | ICD-10-CM | POA: Diagnosis not present

## 2016-06-05 DIAGNOSIS — M79606 Pain in leg, unspecified: Secondary | ICD-10-CM | POA: Diagnosis present

## 2016-06-05 DIAGNOSIS — M5416 Radiculopathy, lumbar region: Secondary | ICD-10-CM

## 2016-06-05 DIAGNOSIS — M533 Sacrococcygeal disorders, not elsewhere classified: Secondary | ICD-10-CM | POA: Diagnosis not present

## 2016-06-05 DIAGNOSIS — M5136 Other intervertebral disc degeneration, lumbar region: Secondary | ICD-10-CM

## 2016-06-05 DIAGNOSIS — M545 Low back pain: Secondary | ICD-10-CM | POA: Diagnosis present

## 2016-06-05 MED ORDER — OXYCODONE HCL 10 MG PO TABS: ORAL_TABLET | ORAL | Status: DC

## 2016-06-05 NOTE — Progress Notes (Signed)
   Subjective:    Patient ID: Katrina Reynolds, female    DOB: 04-Feb-1976, 40 y.o.   MRN: [id number]  HPI    Review of Systems     Objective:   Physical Exam        Assessment & Plan:

## 2016-06-05 NOTE — Patient Instructions (Addendum)
  PLAN   Continue present medications  Neurontin and oxycodone. Decrease Neurontin as discussed to avoid any undesirable side effects Medication can cause respiratory depression and cause you to stop breathing, cause excessive sedation, cause confusion and other side effects.  Exercise extreme caution when taking medication and call EMS or go to the Emergency Department immediately if you develop any of these symptoms   F/U PCP [name] for evaliation of  BP and for evaluation of general medical condition . Please follow-up with [name] regarding GI condition as discussed as well  [facility] evaluation at [facility] as discussed  F/U surgical evaluation. May consider pending follow-up evaluations. Follow-up evaluation of knee as discussed   F/U neurological evaluation. May consider PNCV/EMG studies pending follow-up evaluations  May consider radiofrequency rhizolysis or intraspinal procedures pending response to present treatment and F/U evaluation .  Please ask [name]  and [name] to let you know as soon as you are approved for radiofrequency rhizolysis lumbar facets as we previously discussed  Patient to call Pain Management Center should patient have concerns prior to scheduled return appointment

## 2016-06-05 NOTE — Progress Notes (Signed)
Safety precautions to be maintained throughout the outpatient stay will include: orient to surroundings, keep bed in low position, maintain call bell within reach at all times, provide assistance with transfer out of bed and ambulation.  

## 2016-06-05 NOTE — Progress Notes (Signed)
Subjective:    Patient ID: Katrina Reynolds, female    DOB: 02/11/1976, 40 y.o.   MRN: [id number]  HPI  The patient is a 40 year old female who returns to pain management for further evaluation and treatment of pain involving the mid lower back lower extremity region with headaches at times as well. The patient has undergone recent evaluation and treatment for her GI condition at the present time we'll continue to avoid interventional treatment consisting of steroids. He discussed patient's condition involving the region of the lower back lower extremity region and hips and present time we will continue present medications consisting of Neurontin and oxycodone. The patient is without recent trauma change in events of daily living the call significant change in symptomatology. The patient's pain radiation the lumbar region of the mid back region and continues to the cervical region with pain radiating to the occipitalis region and to the retro-orbital region reproducing headache. We will consider patient for modification of treatment pending response to treatment and follow-up evaluation. All agreed to suggested treatment plan.  Review of Systems     Objective:   Physical Exam   There was tenderness over the splenius capitis and occipitalis region palpation which reproduces pain of moderate degree with moderate tenderness of the cervical facet cervical paraspinal musculature region. Palpation of the acromioclavicular and glenohumeral joint regions reproduce mild discomfort and patient was unremarkable Spurling's maneuver. Palpation over the region of the thoracic region thoracic facet region was attends to palpation with no crepitus of the thoracic region noted. The patient appeared to be with bilaterally equal grip strength and Tinel and Phalen's maneuver were without increase of pain of significant degree. Palpation of the thoracic region and the lumbar region was attends to palpation of moderate  degree with lateral bending rotation extension and palpation of the lumbar facets reproducing moderate discomfort. Palpation of the PSIS and PII S region reproduces moderate to moderately severe discomfort. Straight leg raising was tolerates approximately 30 without increased pain with dorsiflexion noted. EHL strength appeared to be without sensory deficit or dermatomal distribution detected. There was moderate tenderness to palpation to moderately severe tenderness of the greater trochanteric region iliotibial band region. There was negative clonus negative Homans. Abdomen was with mild discomfort with no costovertebral tenderness noted         Assessment & Plan:    Degenerative disc disease of the lumbar spine L3-4 facet joint degenerative changes. L4-L5 facet joint degenerative changes.  Lumbar facet syndrome  Lumbar radiculopathy  Sacroiliac joint dysfunction  Degenerative disc disease lumbar spine   Degenerative joint disease of knees      PLAN   Continue present medications  Neurontin and oxycodone. Decrease Neurontin as discussed to avoid any undesirable side effects Medication can cause respiratory depression and cause you to stop breathing, cause excessive sedation, cause confusion and other side effects.  Exercise extreme caution when taking medication and call EMS or go to the Emergency Department immediately if you develop any of these symptoms   F/U PCP Katrina Reynolds for evaliation of  BP and for evaluation of general medical condition . Please follow-up with Katrina Reynolds regarding GI condition as discussed as well  GI evaluation at [facility] as discussed  F/U surgical evaluation. May consider pending follow-up evaluations. Follow-up evaluation of knee as discussed   F/U neurological evaluation. May consider PNCV/EMG studies pending follow-up evaluations  May consider radiofrequency rhizolysis or intraspinal procedures pending response to  present treatment and F/U  evaluation .  Please ask [name]  and [name] to let you know as soon as you are approved for radiofrequency rhizolysis lumbar facets as we previously discussed  Patient to call [facility] Management Center should patient have concerns prior to scheduled return appointment

## 2016-07-03 ENCOUNTER — Encounter: Payer: Self-pay | Admitting: Pain Medicine

## 2016-07-03 ENCOUNTER — Ambulatory Visit: Payer: BLUE CROSS/BLUE SHIELD | Attending: Pain Medicine | Admitting: Pain Medicine

## 2016-07-03 VITALS — BP 108/84 | HR 89 | Temp 98.3°F | Resp 16 | Ht 64.0 in | Wt 160.0 lb

## 2016-07-03 DIAGNOSIS — M7060 Trochanteric bursitis, unspecified hip: Secondary | ICD-10-CM

## 2016-07-03 DIAGNOSIS — M5416 Radiculopathy, lumbar region: Secondary | ICD-10-CM

## 2016-07-03 DIAGNOSIS — M5136 Other intervertebral disc degeneration, lumbar region: Secondary | ICD-10-CM

## 2016-07-03 DIAGNOSIS — M533 Sacrococcygeal disorders, not elsewhere classified: Secondary | ICD-10-CM

## 2016-07-03 DIAGNOSIS — M47816 Spondylosis without myelopathy or radiculopathy, lumbar region: Secondary | ICD-10-CM

## 2016-07-03 DIAGNOSIS — M170 Bilateral primary osteoarthritis of knee: Secondary | ICD-10-CM

## 2016-07-03 MED ORDER — TOPIRAMATE 25 MG PO TABS: ORAL_TABLET | ORAL | Status: DC

## 2016-07-03 MED ORDER — OXYCODONE HCL 10 MG PO TABS: ORAL_TABLET | ORAL | Status: DC

## 2016-07-03 NOTE — Progress Notes (Signed)
Safety precautions to be maintained throughout the outpatient stay will include: orient to surroundings, keep bed in low position, maintain call bell within reach at all times, provide assistance with transfer out of bed and ambulation.  

## 2016-07-03 NOTE — Progress Notes (Signed)
Subjective:    Patient ID: Katrina Reynolds, female    DOB: May 11, 1976, 40 y.o.   MRN: [id number]  HPI  The patient is a 40 year old female who returns to pain management for further evaluation and treatment of pain involving the neck associated with headaches as well as lower back lower extremity pain. The patient states the pain is fairly well-controlled at this time and patient is able to perform most activities of daily living without severe disabling pain interfere with all activities of daily living. The patient does continues to have headaches as well as pain in the lower back lower extremity region. The patient was unable to tolerate Neurontin. We will replace Neurontin with Topamax and we will continue oxycodone as prescribed. All agreed to suggested treatment plan. The patient is to call pain management should they be significant change in condition prior to scheduled return appointment. All agreed to suggested treatment plan Review of Systems     Objective:   Physical Exam    There was moderate tenderness to palpation of the splenius capitis and occipitalis region reproduced pain of mild to moderate degree with mild to moderate tenderness of the cervical facet cervical paraspinal musculature region. Palpation of the acromial clavicular and glenohumeral joint regions reproduce mild discomfort and patient appeared to be with unremarkable Spurling's maneuver. Patient was able to perform drop test without significant difficulty. Palpation over the thoracic region was with no crepitus of the thoracic region noted. There was moderate muscle spasm above the thoracic region. Patient appeared to be with bilaterally equal grip strength without increased pain with Tinel and Phalen's maneuver. Palpation over the lumbar region was of increased pain of moderate degree with lateral bending rotation extension and palpation of the lumbar facets reproducing moderately severe discomfort. Palpation of the  greater trochanteric region iliotibial band region was without increased pain of significant degree. DTRs appeared to be trace at the knees. Palpation over the PSIS and PII S region reproduces moderate discomfort with moderate tenderness to moderate severe tenderness of the greater trochanteric region iliotibial band region. EHL strength appeared to be decreased. No definite sensory deficit or dermatomal dystrophy detected. Negative clonus negative Homans. Abdomen was without excessive tends to palpation and no costovertebral tenderness noted     Assessment & Plan:       Degenerative disc disease of the lumbar spine L3-4 facet joint degenerative changes. L4-L5 facet joint degenerative changes.  Lumbar facet syndrome  Lumbar radiculopathy  Sacroiliac joint dysfunction  Degenerative disc disease lumbar spine   Degenerative joint disease of knees       PLAN   Continue present medication oxycodone.     BEGIN TOPAMAX           NO NEURONTIN Medication can cause respiratory depression and cause you to stop breathing, cause excessive sedation, cause confusion and other side effects.  Exercise extreme caution when taking medication and call EMS or go to the Emergency Department immediately if you develop any of these symptoms   F/U PCP [name] for evaliation of  BP and for evaluation of general medical condition . Please follow-up with [name] regarding GI condition as discussed  GI evaluation at [facility] as discussed  F/U surgical evaluation. May consider pending follow-up evaluations. Follow-up evaluation of knee as discussed   F/U neurological evaluation. May consider PNCV/EMG studies pending follow-up evaluations  May consider radiofrequency rhizolysis or intraspinal procedures pending response to present treatment and F/U evaluation .  Please  ask [name]  and [name] to let you know as soon as you are approved for radiofrequency rhizolysis lumbar  facets as we previously discussed  Patient to call [facility] Management Center should patient have concerns prior to scheduled return appointment

## 2016-07-03 NOTE — Patient Instructions (Addendum)
  PLAN   Continue present medication oxycodone.  BEGIN TOPAMAX           NO NEURONTIN Medication can cause respiratory depression and cause you to stop breathing, cause excessive sedation, cause confusion and other side effects.  Exercise extreme caution when taking medication and call EMS or go to the Emergency Department immediately if you develop any of these symptoms   F/U PCP [name] for evaliation of  BP and for evaluation of general medical condition . Please follow-up with [name] regarding GI condition as discussed  GI evaluation at [facility] as discussed  F/U surgical evaluation. May consider pending follow-up evaluations. Follow-up evaluation of knee as discussed   F/U neurological evaluation. May consider PNCV/EMG studies pending follow-up evaluations  May consider radiofrequency rhizolysis or intraspinal procedures pending response to present treatment and F/U evaluation .  Please ask [name]  and [name] to let you know as soon as you are approved for radiofrequency rhizolysis lumbar facets as we previously discussed  Patient to call Pain Management Center should patient have concerns prior to scheduled return appointment

## 2016-08-05 ENCOUNTER — Ambulatory Visit: Payer: BLUE CROSS/BLUE SHIELD | Attending: Pain Medicine | Admitting: Pain Medicine

## 2016-08-05 ENCOUNTER — Encounter: Payer: Self-pay | Admitting: Pain Medicine

## 2016-08-05 VITALS — BP 119/79 | HR 41 | Temp 95.8°F | Resp 15 | Ht 64.0 in | Wt 160.0 lb

## 2016-08-05 DIAGNOSIS — M5116 Intervertebral disc disorders with radiculopathy, lumbar region: Secondary | ICD-10-CM | POA: Insufficient documentation

## 2016-08-05 DIAGNOSIS — M170 Bilateral primary osteoarthritis of knee: Secondary | ICD-10-CM | POA: Diagnosis not present

## 2016-08-05 DIAGNOSIS — M5136 Other intervertebral disc degeneration, lumbar region: Secondary | ICD-10-CM

## 2016-08-05 DIAGNOSIS — M47896 Other spondylosis, lumbar region: Secondary | ICD-10-CM | POA: Diagnosis not present

## 2016-08-05 DIAGNOSIS — M47816 Spondylosis without myelopathy or radiculopathy, lumbar region: Secondary | ICD-10-CM

## 2016-08-05 DIAGNOSIS — M1712 Unilateral primary osteoarthritis, left knee: Secondary | ICD-10-CM

## 2016-08-05 DIAGNOSIS — M533 Sacrococcygeal disorders, not elsewhere classified: Secondary | ICD-10-CM

## 2016-08-05 DIAGNOSIS — M542 Cervicalgia: Secondary | ICD-10-CM | POA: Diagnosis present

## 2016-08-05 DIAGNOSIS — M546 Pain in thoracic spine: Secondary | ICD-10-CM | POA: Diagnosis present

## 2016-08-05 MED ORDER — OXYCODONE HCL 10 MG PO TABS: ORAL_TABLET | ORAL | 0 refills | Status: DC

## 2016-08-05 MED ORDER — TOPIRAMATE 25 MG PO TABS: ORAL_TABLET | ORAL | 0 refills | Status: DC

## 2016-08-05 NOTE — Progress Notes (Signed)
The patient is a 40 year old female who returns to pain management for further evaluation and treatment of pain involving the neck entire back upper and lower extremity regions with pain involving the region of the knees especially the left knee at this time. The patient states that she has to work in extremely cold environment several hours per day tendinosis cause severe pain involving the knee as well as other regions. We've advised patient to avoid nonsteroidal anti-inflammatory medications due to patient's GI condition. The patient states that the pain is severely disabling. The patient also admits to pain involving the lower back and lower extremity region and headaches to a lesser degree. We will proceed with genicular nerve blocks of the knee at time of return appointment in attempt to decrease severity of patient's symptoms, minimize progression of symptoms, and avoid the need for more involved treatment. The patient was in agreement with suggested treatment plan. The patient was unable to tolerate Topamax which she discontinued. The patient will continue oxycodone as prescribed at this time.    Physical examination  There was tenderness to palpation of the splenius capitis and occipitalis region palpation which reproduces moderate discomfort. There was moderate tenderness of the cervical facet cervical paraspinal musculature region as well as the trapezius musculature region levator scapula and rhomboid musculature regions. Palpation of the acromioclavicular and glenohumeral joint regions reproduce mild to moderate discomfort and patient was with unremarkable drop test and unremarkable Spurling's maneuver. Palpation over the thoracic region was attends to palpation of moderate degree with moderate muscle spasms. Palpation over the lumbar region was with tenderness to palpation of moderate degree with lateral bending rotation extension and palpation of the lumbar facets reproducing moderate  discomfort. Straight leg raising was tolerates approximately 30 without increased pain with dorsiflexion noted. The knees were with tenderness to palpation with negative anterior and posterior drawer signs there was crepitus of the knees and significant increased pain with range of motion maneuvers of the knee. There was no definite sensory deficit or dermatomal distribution detected and there was negative clonus negative Homans. Abdomen was without tenderness to palpation and no costovertebral angle tenderness noted    Assessment   Degenerative disc disease of the lumbar spine L3-4 facet joint degenerative changes. L4-L5 facet joint degenerative changes.  Lumbar facet syndrome  Lumbar radiculopathy  Sacroiliac joint dysfunction  Degenerative disc disease lumbar spine   Degenerative joint disease of knees     PLAN   Continue present medication oxycodone    NO TOPAMAX         NO NEURONTIN Medication can cause respiratory depression and cause you to stop breathing, cause excessive sedation, cause confusion and other side effects.  Exercise extreme caution when taking medication and call EMS or go to the Emergency Department immediately if you develop any of these symptoms   Genicular nerve blocks to be performed at time of return appointment  F/U PCP [name] for evaliation of  BP and for evaluation of general medical condition . Please follow-up with [name] regarding GI condition as discussed   F/U GI evaluation at [facility]  F/U surgical evaluation. May consider pending follow-up evaluations. Follow-up evaluation of knee as discussed   F/U neurological evaluation. May consider PNCV/EMG studies pending follow-up evaluations  May consider radiofrequency rhizolysis or intraspinal procedures pending response to present treatment and F/U evaluation .  Please ask [name]  and [name] to let you know as soon as you are approved  for radiofrequency  rhizolysis lumbar facets as we previously discussed  Patient to call Pain Management Center should patient have concerns prior to scheduled return appointment

## 2016-08-05 NOTE — Progress Notes (Signed)
Safety precautions to be maintained throughout the outpatient stay will include: orient to surroundings, keep bed in low position, maintain call bell within reach at all times, provide assistance with transfer out of bed and ambulation.  

## 2016-08-05 NOTE — Patient Instructions (Addendum)
  PLAN   Continue present medication oxycodone    NO TOPAMAX         NO NEURONTIN Medication can cause respiratory depression and cause you to stop breathing, cause excessive sedation, cause confusion and other side effects.  Exercise extreme caution when taking medication and call EMS or go to the Emergency Department immediately if you develop any of these symptoms   Genicular nerve blocks to be performed at time of return appointment  F/U PCP [name] for evaliation of  BP and for evaluation of general medical condition . Please follow-up with [name] regarding GI condition as discussed   F/U [facility] evaluation at [facility]  F/U surgical evaluation. May consider pending follow-up evaluations. Follow-up evaluation of knee as discussed   F/U neurological evaluation. May consider PNCV/EMG studies pending follow-up evaluations  May consider radiofrequency rhizolysis or intraspinal procedures pending response to present treatment and F/U evaluation .  Please ask [name]  and [name] to let you know as soon as you are approved for radiofrequency rhizolysis lumbar facets as we previously discussed  Patient to call Pain Management Center should patient have concerns prior to scheduled return appointment  Nerve Block Patient Information  Preparation for the injection:  1. Do not eat any solid food or dairy products within 8 hours of your appointment. 2. You may drink clear liquids up to 3 hours before an appointment.  Clear liquids include water, black coffee, juice or soda.  No milk or cream please. 3. You may take your regular medications, including pain medications, with a sip of water before your appointment.  Diabetics should hold regular insulin (if taken separately) and take 1/2 normal NPH dose the morning of the procedure.  Carry some sugar containing items with you to your appointment. 4. A driver must accompany you and be prepared to drive you home after your  procedure. 5. Bring all your current medications with you. 6. An IV may be inserted and sedation may be given at the discretion of the physician. 7. A blood pressure cuff, EKG, and other monitors will often be applied during the procedure.  Some patients may need to have extra oxygen administered for a short period. 8. You will be asked to provide medical information, including allergies, prior to the procedure.  We must know immediately if you are taking blood  Thinners (like Coumadin) or if you are allergic to IV iodine contrast (dye).  Possible side-effects: All are usually temporary  Bleeding from needle site  Light headedness  Numbness and tingling  Decreased blood pressure  Weakness in legs  Pain at injection site (several days)  Possible complications: All are extremely rare  Infection  Nerve injury  Call if you experience:  New onset weakness or numbness of an extremity below the injection site  Hives or difficulty breathing (go to the emergency room)  Inflammation or drainage at the injection site(s)  New symptoms which are concerning to you  Please note:  Although the local anesthetic injected can often make your knee feel good for several hours after the injection the pain will likely return.  It takes 3-5 days for steroids to work on the nerve. You may not notice any pain relief for at least one week.   If you have any questions, please call [phone] [facility]

## 2016-08-06 ENCOUNTER — Telehealth: Payer: Self-pay | Admitting: Pain Medicine

## 2016-08-06 NOTE — Telephone Encounter (Signed)
Just Katrina Reynolds called stating her insurance will not and has not covered her procedures or UDS  They are telling her Dr. Primus Bravo is not in network with [facility]. I gave patient the number for Dr. Primus Bravo billing and she is going to check into this. She canceled procedure appt.

## 2016-08-19 ENCOUNTER — Telehealth: Payer: Self-pay | Admitting: *Deleted

## 2016-08-20 ENCOUNTER — Ambulatory Visit: Payer: BLUE CROSS/BLUE SHIELD | Admitting: Pain Medicine

## 2016-09-03 ENCOUNTER — Ambulatory Visit: Payer: BLUE CROSS/BLUE SHIELD | Admitting: Pain Medicine

## 2016-09-07 ENCOUNTER — Other Ambulatory Visit: Payer: Self-pay | Admitting: Pain Medicine

## 2016-09-08 ENCOUNTER — Other Ambulatory Visit: Payer: Self-pay | Admitting: Pain Medicine

## 2016-11-11 IMAGING — CT CT ABD-PELV W/ CM
2 of 5 series · 16 of 46 positions shown, 18 images · IV contrast (Omnipaque 300)
Comparison: Renal stone CT 12/17/2006.

CLINICAL DATA: Patient with nausea, vomiting and diarrhea. Diffuse
abdominal pain.

EXAM:
CT ABDOMEN AND PELVIS WITH CONTRAST
TECHNIQUE: Multidetector CT imaging of the abdomen and pelvis was performed
using the standard protocol following bolus administration of
intravenous contrast.
CONTRAST:  100mL OMNIPAQUE IOHEXOL 300 MG/ML  SOLN

[Series 2: abd_pel_with 5.0 b40f · axial · 0.68mm/px · z∈[-464,-24]mm · 13 of 100 slices shown, 15 images]
[im 6/100  soft-tissue]
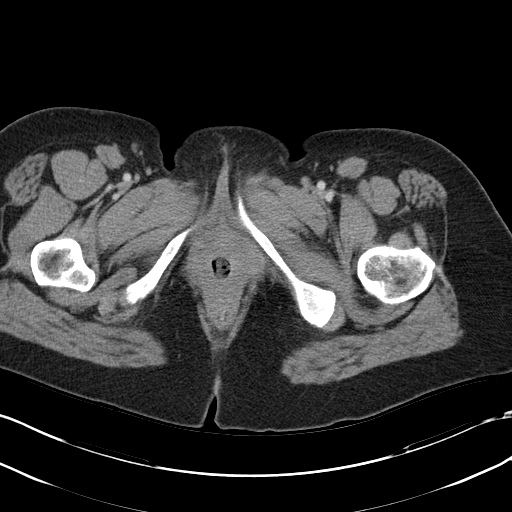
[im 6/100  bone]
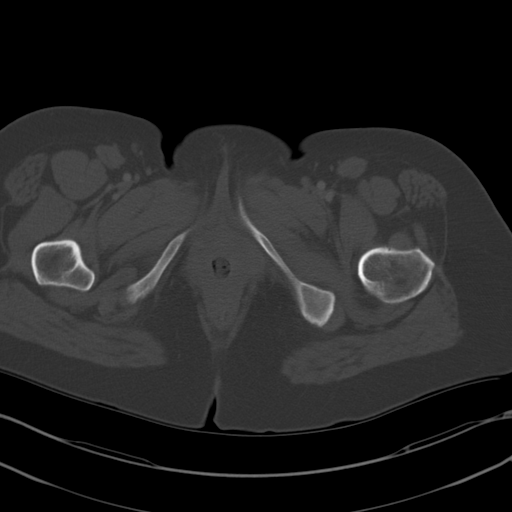
[im 12/100  soft-tissue]
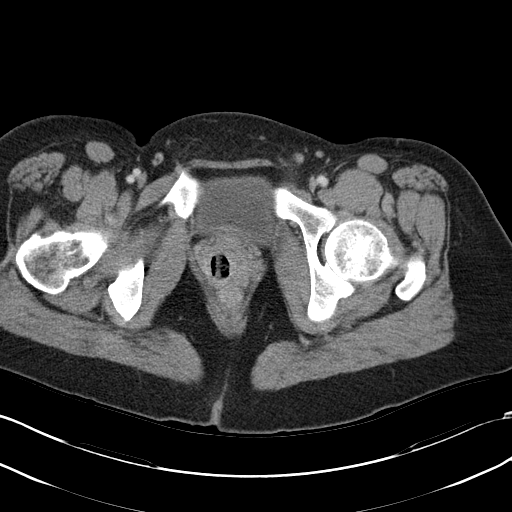
[im 23/100  soft-tissue]
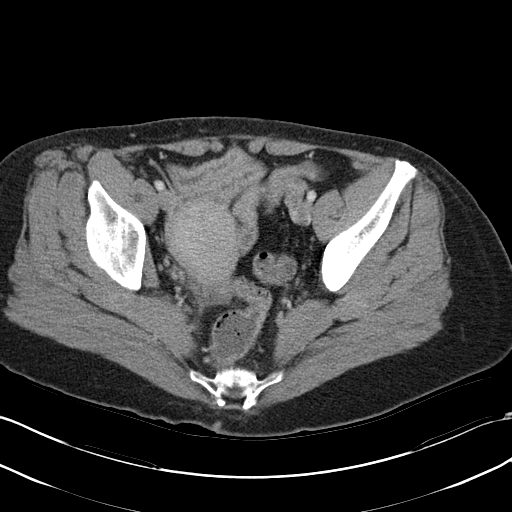
[im 28/100  soft-tissue]
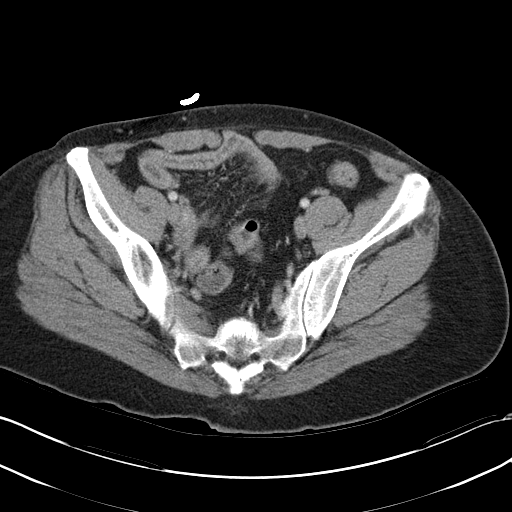
[im 34/100  soft-tissue]
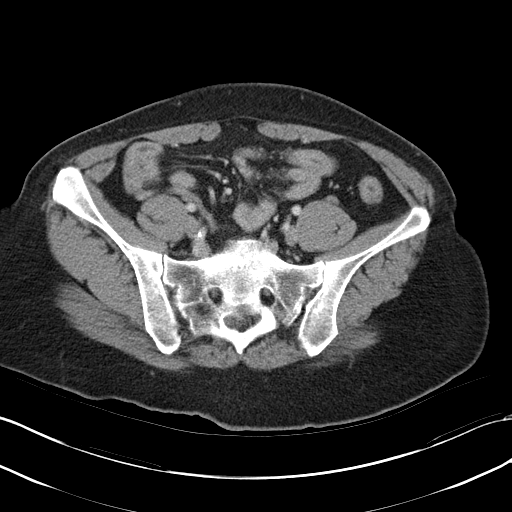
[im 45/100  soft-tissue]
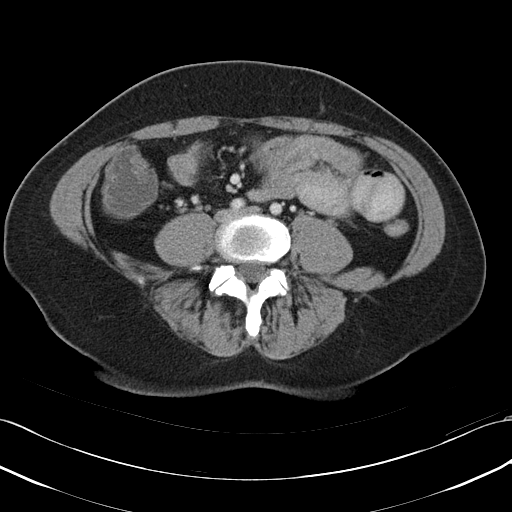
[im 50/100  soft-tissue]
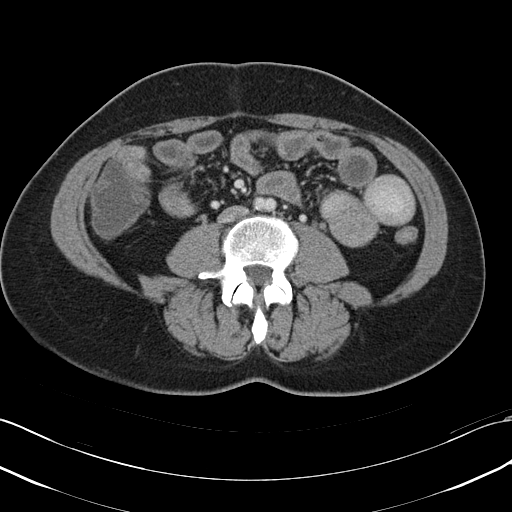
[im 56/100  soft-tissue]
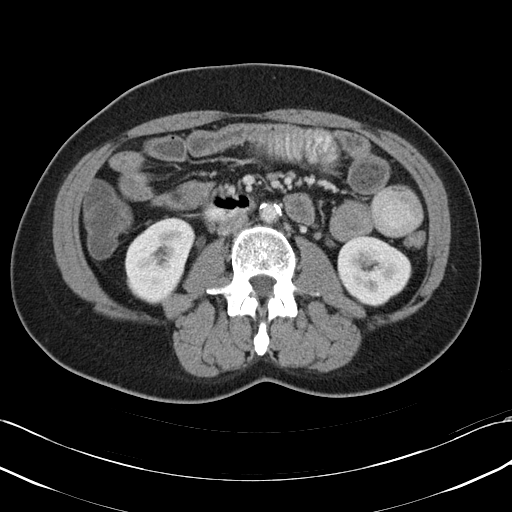
[im 67/100  soft-tissue]
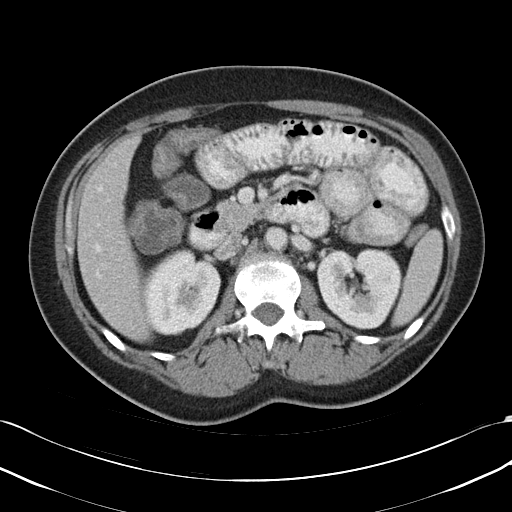
[im 67/100  bone]
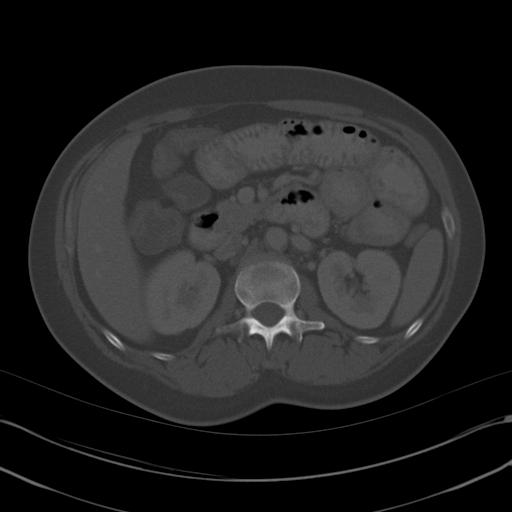
[im 72/100  soft-tissue]
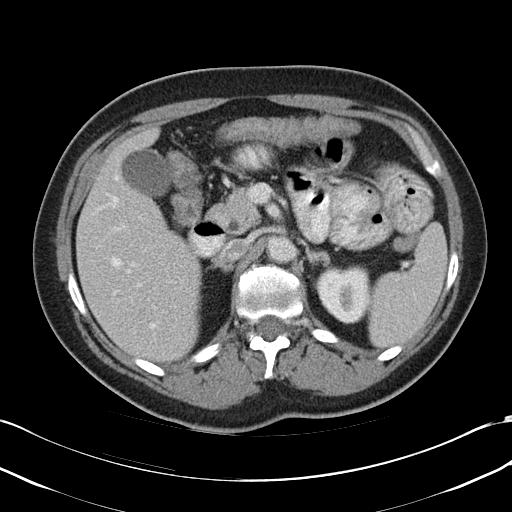
[im 78/100  soft-tissue]
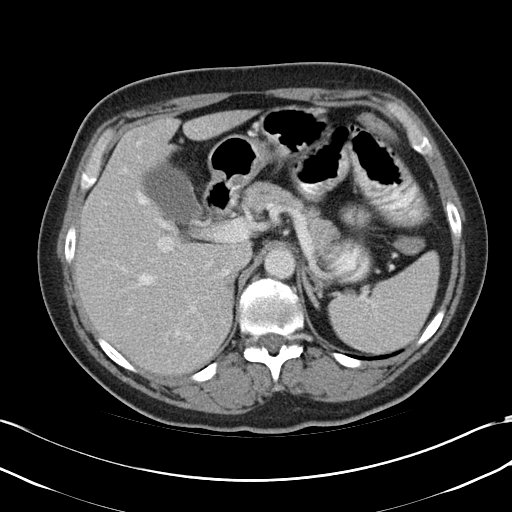
[im 89/100  soft-tissue]
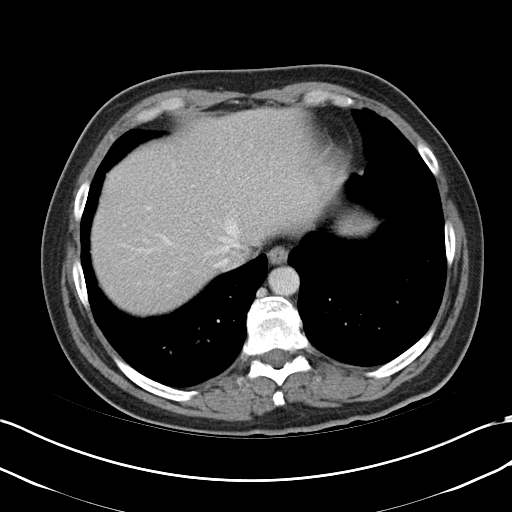
[im 94/100  soft-tissue]
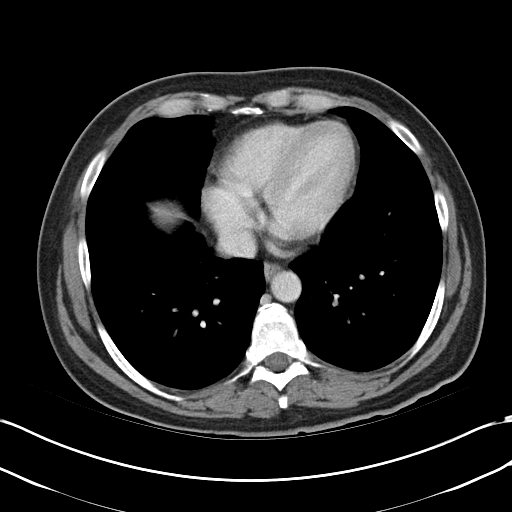

[Series 4: abd_pel_with 3.0 spo cor · coronal · 0.70mm/px · 3 of 76 slices shown]
[im 26/76  soft-tissue]
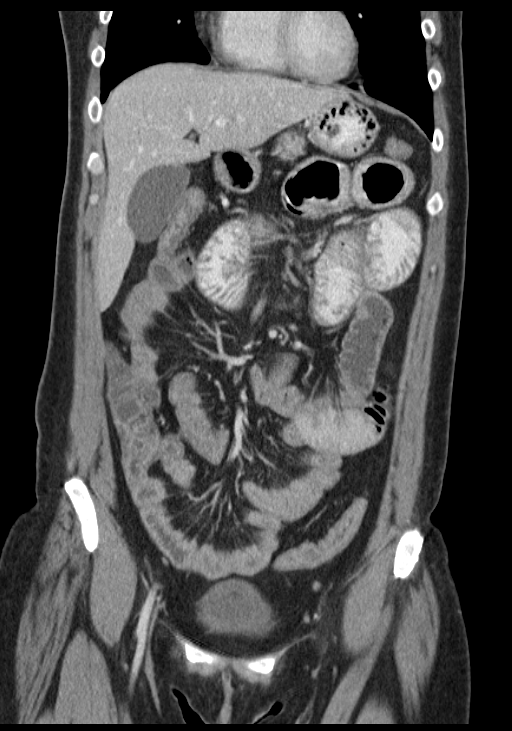
[im 34/76  soft-tissue]
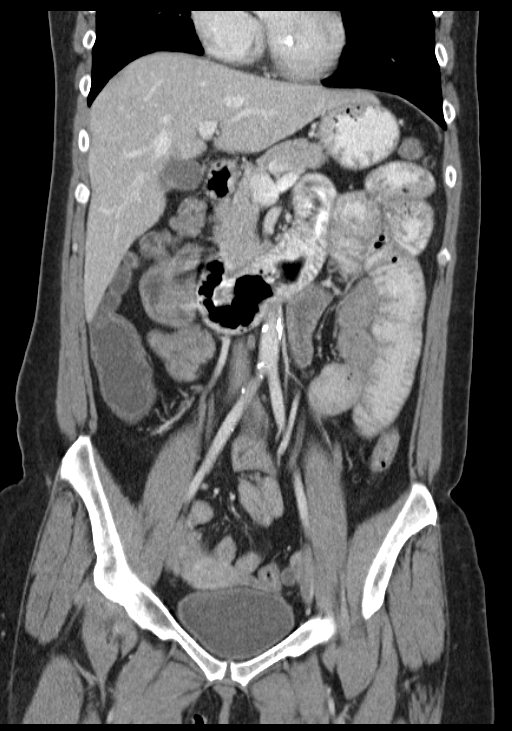
[im 42/76  soft-tissue]
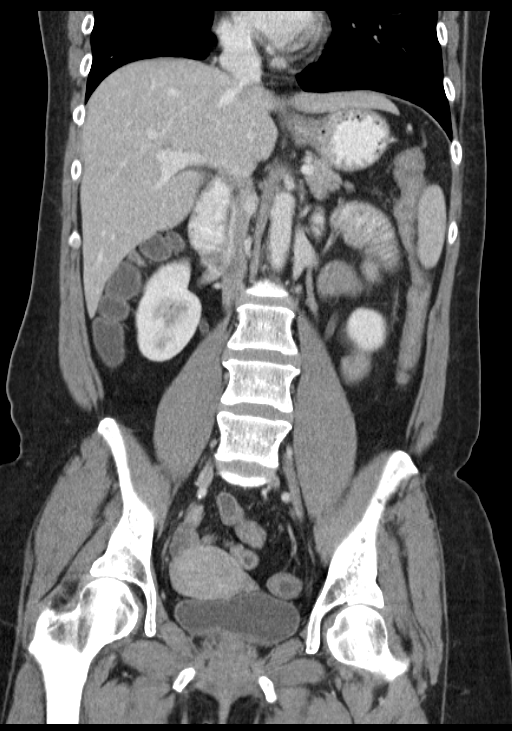

[16 of 46 positions shown; findings below may reference images not displayed]

FINDINGS: Lower chest: Normal heart size. No pericardial effusion. Lung bases
are clear. No pleural effusion.

Hepatobiliary: Liver is normal in size and contour. No focal hepatic
lesions identified. Gallbladder is unremarkable. No intrahepatic or
extrahepatic biliary ductal dilatation.

Pancreas: Unremarkable

Spleen: Unremarkable

Adrenals/Urinary Tract: Unchanged nodularity involving the left
adrenal gland, potentially representing an adenoma. Kidneys enhance
symmetrically with contrast. No hydronephrosis. Urinary bladder is
unremarkable.

Stomach/Bowel: No abnormal bowel wall thickening or evidence for
bowel obstruction. No free fluid or free intraperitoneal air. The
appendix is normal. Normal morphology of the stomach.

Vascular/Lymphatic: Normal caliber abdominal aorta. Peripheral
calcified atherosclerotic plaque. No retroperitoneal
lymphadenopathy.

Other: The uterus and adnexal structures are unremarkable.

Musculoskeletal: No aggressive or acute appearing osseous lesions.
IMPRESSION: No acute process within the abdomen or pelvis.

Normal appendix.

## 2016-11-17 ENCOUNTER — Ambulatory Visit (HOSPITAL_COMMUNITY)
Admission: EM | Admit: 2016-11-17 | Discharge: 2016-11-17 | Disposition: A | Discharge status: Home / Self Care | Payer: BLUE CROSS/BLUE SHIELD | Attending: Family Medicine | Admitting: Family Medicine

## 2016-11-17 ENCOUNTER — Encounter (HOSPITAL_COMMUNITY): Payer: Self-pay | Admitting: Emergency Medicine

## 2016-11-17 DIAGNOSIS — J0100 Acute maxillary sinusitis, unspecified: Secondary | ICD-10-CM

## 2016-11-17 DIAGNOSIS — J206 Acute bronchitis due to rhinovirus: Secondary | ICD-10-CM | POA: Diagnosis not present

## 2016-11-17 MED ORDER — BENZONATATE 100 MG PO CAPS: 200.0000 mg | ORAL_CAPSULE | Freq: Three times a day (TID) | ORAL | 0 refills | Status: DC | PRN

## 2016-11-17 MED ORDER — AMOXICILLIN 875 MG PO TABS
875.0000 mg | ORAL_TABLET | Freq: Two times a day (BID) | ORAL | 0 refills | Status: DC
Start: 2016-11-17 — End: 2019-08-02
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
## 2016-11-17 NOTE — ED Triage Notes (Signed)
The patient presented to the [facility] with a complaint of a cough and chest congestion x 1 week. The patient reported that she is taking Cipro that was prescribed by her PCP that she stated is not working.

## 2017-11-17 DIAGNOSIS — Z5181 Encounter for therapeutic drug level monitoring: Secondary | ICD-10-CM | POA: Diagnosis not present

## 2017-11-17 DIAGNOSIS — G43909 Migraine, unspecified, not intractable, without status migrainosus: Secondary | ICD-10-CM | POA: Diagnosis not present

## 2017-11-17 DIAGNOSIS — R51 Headache: Secondary | ICD-10-CM | POA: Diagnosis not present

## 2017-11-17 DIAGNOSIS — M5481 Occipital neuralgia: Secondary | ICD-10-CM | POA: Diagnosis not present

## 2017-12-16 DIAGNOSIS — Z5181 Encounter for therapeutic drug level monitoring: Secondary | ICD-10-CM | POA: Diagnosis not present

## 2017-12-16 DIAGNOSIS — R51 Headache: Secondary | ICD-10-CM | POA: Diagnosis not present

## 2017-12-16 DIAGNOSIS — G43909 Migraine, unspecified, not intractable, without status migrainosus: Secondary | ICD-10-CM | POA: Diagnosis not present

## 2017-12-16 DIAGNOSIS — M5481 Occipital neuralgia: Secondary | ICD-10-CM | POA: Diagnosis not present

## 2018-01-11 DIAGNOSIS — M5481 Occipital neuralgia: Secondary | ICD-10-CM | POA: Diagnosis not present

## 2018-01-11 DIAGNOSIS — G43909 Migraine, unspecified, not intractable, without status migrainosus: Secondary | ICD-10-CM | POA: Diagnosis not present

## 2018-01-11 DIAGNOSIS — Z5181 Encounter for therapeutic drug level monitoring: Secondary | ICD-10-CM | POA: Diagnosis not present

## 2018-01-11 DIAGNOSIS — R51 Headache: Secondary | ICD-10-CM | POA: Diagnosis not present

## 2018-02-09 DIAGNOSIS — M5481 Occipital neuralgia: Secondary | ICD-10-CM | POA: Diagnosis not present

## 2018-02-09 DIAGNOSIS — G43909 Migraine, unspecified, not intractable, without status migrainosus: Secondary | ICD-10-CM | POA: Diagnosis not present

## 2018-02-09 DIAGNOSIS — R51 Headache: Secondary | ICD-10-CM | POA: Diagnosis not present

## 2018-02-09 DIAGNOSIS — Z5181 Encounter for therapeutic drug level monitoring: Secondary | ICD-10-CM | POA: Diagnosis not present

## 2018-03-03 DIAGNOSIS — H16011 Central corneal ulcer, right eye: Secondary | ICD-10-CM | POA: Diagnosis not present

## 2018-03-03 DIAGNOSIS — Z947 Corneal transplant status: Secondary | ICD-10-CM | POA: Diagnosis not present

## 2018-03-03 DIAGNOSIS — Z961 Presence of intraocular lens: Secondary | ICD-10-CM | POA: Diagnosis not present

## 2018-03-03 DIAGNOSIS — H40041 Steroid responder, right eye: Secondary | ICD-10-CM | POA: Diagnosis not present

## 2018-03-09 DIAGNOSIS — M5481 Occipital neuralgia: Secondary | ICD-10-CM | POA: Diagnosis not present

## 2018-03-09 DIAGNOSIS — Z5181 Encounter for therapeutic drug level monitoring: Secondary | ICD-10-CM | POA: Diagnosis not present

## 2018-03-09 DIAGNOSIS — R51 Headache: Secondary | ICD-10-CM | POA: Diagnosis not present

## 2018-03-09 DIAGNOSIS — G43909 Migraine, unspecified, not intractable, without status migrainosus: Secondary | ICD-10-CM | POA: Diagnosis not present

## 2018-03-18 DIAGNOSIS — Z961 Presence of intraocular lens: Secondary | ICD-10-CM | POA: Diagnosis not present

## 2018-03-18 DIAGNOSIS — Z8669 Personal history of other diseases of the nervous system and sense organs: Secondary | ICD-10-CM | POA: Diagnosis not present

## 2018-03-18 DIAGNOSIS — Z947 Corneal transplant status: Secondary | ICD-10-CM | POA: Diagnosis not present

## 2018-03-18 DIAGNOSIS — H40041 Steroid responder, right eye: Secondary | ICD-10-CM | POA: Diagnosis not present

## 2018-03-25 DIAGNOSIS — T86840 Corneal transplant rejection: Secondary | ICD-10-CM | POA: Diagnosis not present

## 2018-03-25 DIAGNOSIS — H40041 Steroid responder, right eye: Secondary | ICD-10-CM | POA: Diagnosis not present

## 2018-03-25 DIAGNOSIS — Z961 Presence of intraocular lens: Secondary | ICD-10-CM | POA: Diagnosis not present

## 2018-03-25 DIAGNOSIS — Z947 Corneal transplant status: Secondary | ICD-10-CM | POA: Diagnosis not present

## 2018-03-31 DIAGNOSIS — H18221 Idiopathic corneal edema, right eye: Secondary | ICD-10-CM | POA: Diagnosis not present

## 2018-03-31 DIAGNOSIS — Z947 Corneal transplant status: Secondary | ICD-10-CM | POA: Diagnosis not present

## 2018-03-31 DIAGNOSIS — H16011 Central corneal ulcer, right eye: Secondary | ICD-10-CM | POA: Diagnosis not present

## 2018-03-31 DIAGNOSIS — T86840 Corneal transplant rejection: Secondary | ICD-10-CM | POA: Diagnosis not present

## 2018-04-06 DIAGNOSIS — G43909 Migraine, unspecified, not intractable, without status migrainosus: Secondary | ICD-10-CM | POA: Diagnosis not present

## 2018-04-06 DIAGNOSIS — M5481 Occipital neuralgia: Secondary | ICD-10-CM | POA: Diagnosis not present

## 2018-04-06 DIAGNOSIS — R51 Headache: Secondary | ICD-10-CM | POA: Diagnosis not present

## 2018-04-06 DIAGNOSIS — Z5181 Encounter for therapeutic drug level monitoring: Secondary | ICD-10-CM | POA: Diagnosis not present

## 2018-04-28 DIAGNOSIS — H18231 Secondary corneal edema, right eye: Secondary | ICD-10-CM | POA: Diagnosis not present

## 2018-04-28 DIAGNOSIS — T86840 Corneal transplant rejection: Secondary | ICD-10-CM | POA: Diagnosis not present

## 2018-04-28 DIAGNOSIS — Z947 Corneal transplant status: Secondary | ICD-10-CM | POA: Diagnosis not present

## 2018-04-28 DIAGNOSIS — H40041 Steroid responder, right eye: Secondary | ICD-10-CM | POA: Diagnosis not present

## 2018-06-01 DIAGNOSIS — Z5181 Encounter for therapeutic drug level monitoring: Secondary | ICD-10-CM | POA: Diagnosis not present

## 2018-06-01 DIAGNOSIS — G43909 Migraine, unspecified, not intractable, without status migrainosus: Secondary | ICD-10-CM | POA: Diagnosis not present

## 2018-06-01 DIAGNOSIS — M5481 Occipital neuralgia: Secondary | ICD-10-CM | POA: Diagnosis not present

## 2018-06-01 DIAGNOSIS — R51 Headache: Secondary | ICD-10-CM | POA: Diagnosis not present

## 2018-06-29 DIAGNOSIS — Z5181 Encounter for therapeutic drug level monitoring: Secondary | ICD-10-CM | POA: Diagnosis not present

## 2018-06-29 DIAGNOSIS — M5481 Occipital neuralgia: Secondary | ICD-10-CM | POA: Diagnosis not present

## 2018-06-29 DIAGNOSIS — R51 Headache: Secondary | ICD-10-CM | POA: Diagnosis not present

## 2018-06-29 DIAGNOSIS — G43909 Migraine, unspecified, not intractable, without status migrainosus: Secondary | ICD-10-CM | POA: Diagnosis not present

## 2018-07-27 DIAGNOSIS — G43909 Migraine, unspecified, not intractable, without status migrainosus: Secondary | ICD-10-CM | POA: Diagnosis not present

## 2018-07-27 DIAGNOSIS — Z5181 Encounter for therapeutic drug level monitoring: Secondary | ICD-10-CM | POA: Diagnosis not present

## 2018-07-27 DIAGNOSIS — M5481 Occipital neuralgia: Secondary | ICD-10-CM | POA: Diagnosis not present

## 2018-07-27 DIAGNOSIS — M722 Plantar fascial fibromatosis: Secondary | ICD-10-CM | POA: Diagnosis not present

## 2018-08-24 DIAGNOSIS — G43909 Migraine, unspecified, not intractable, without status migrainosus: Secondary | ICD-10-CM | POA: Diagnosis not present

## 2018-08-24 DIAGNOSIS — M722 Plantar fascial fibromatosis: Secondary | ICD-10-CM | POA: Diagnosis not present

## 2018-08-24 DIAGNOSIS — M5481 Occipital neuralgia: Secondary | ICD-10-CM | POA: Diagnosis not present

## 2018-08-24 DIAGNOSIS — Z5181 Encounter for therapeutic drug level monitoring: Secondary | ICD-10-CM | POA: Diagnosis not present

## 2018-09-21 DIAGNOSIS — Z5181 Encounter for therapeutic drug level monitoring: Secondary | ICD-10-CM | POA: Diagnosis not present

## 2018-09-21 DIAGNOSIS — M722 Plantar fascial fibromatosis: Secondary | ICD-10-CM | POA: Diagnosis not present

## 2018-09-21 DIAGNOSIS — M5481 Occipital neuralgia: Secondary | ICD-10-CM | POA: Diagnosis not present

## 2018-09-21 DIAGNOSIS — G43909 Migraine, unspecified, not intractable, without status migrainosus: Secondary | ICD-10-CM | POA: Diagnosis not present

## 2018-10-14 DIAGNOSIS — M5481 Occipital neuralgia: Secondary | ICD-10-CM | POA: Diagnosis not present

## 2018-10-14 DIAGNOSIS — Z5181 Encounter for therapeutic drug level monitoring: Secondary | ICD-10-CM | POA: Diagnosis not present

## 2018-10-14 DIAGNOSIS — G43909 Migraine, unspecified, not intractable, without status migrainosus: Secondary | ICD-10-CM | POA: Diagnosis not present

## 2018-10-14 DIAGNOSIS — M722 Plantar fascial fibromatosis: Secondary | ICD-10-CM | POA: Diagnosis not present

## 2018-11-17 DIAGNOSIS — Z5181 Encounter for therapeutic drug level monitoring: Secondary | ICD-10-CM | POA: Diagnosis not present

## 2018-11-17 DIAGNOSIS — M722 Plantar fascial fibromatosis: Secondary | ICD-10-CM | POA: Diagnosis not present

## 2018-11-17 DIAGNOSIS — M5481 Occipital neuralgia: Secondary | ICD-10-CM | POA: Diagnosis not present

## 2018-11-17 DIAGNOSIS — G43909 Migraine, unspecified, not intractable, without status migrainosus: Secondary | ICD-10-CM | POA: Diagnosis not present

## 2018-12-13 DIAGNOSIS — Z5181 Encounter for therapeutic drug level monitoring: Secondary | ICD-10-CM | POA: Diagnosis not present

## 2018-12-13 DIAGNOSIS — G43909 Migraine, unspecified, not intractable, without status migrainosus: Secondary | ICD-10-CM | POA: Diagnosis not present

## 2018-12-13 DIAGNOSIS — M5481 Occipital neuralgia: Secondary | ICD-10-CM | POA: Diagnosis not present

## 2018-12-13 DIAGNOSIS — M722 Plantar fascial fibromatosis: Secondary | ICD-10-CM | POA: Diagnosis not present

## 2019-01-10 DIAGNOSIS — Z5181 Encounter for therapeutic drug level monitoring: Secondary | ICD-10-CM | POA: Diagnosis not present

## 2019-01-10 DIAGNOSIS — M5481 Occipital neuralgia: Secondary | ICD-10-CM | POA: Diagnosis not present

## 2019-01-10 DIAGNOSIS — M722 Plantar fascial fibromatosis: Secondary | ICD-10-CM | POA: Diagnosis not present

## 2019-01-10 DIAGNOSIS — G43909 Migraine, unspecified, not intractable, without status migrainosus: Secondary | ICD-10-CM | POA: Diagnosis not present

## 2019-02-07 DIAGNOSIS — Z79891 Long term (current) use of opiate analgesic: Secondary | ICD-10-CM | POA: Diagnosis not present

## 2019-02-07 DIAGNOSIS — M259 Joint disorder, unspecified: Secondary | ICD-10-CM | POA: Diagnosis not present

## 2019-02-07 DIAGNOSIS — G894 Chronic pain syndrome: Secondary | ICD-10-CM | POA: Diagnosis not present

## 2019-03-07 DIAGNOSIS — G43909 Migraine, unspecified, not intractable, without status migrainosus: Secondary | ICD-10-CM | POA: Diagnosis not present

## 2019-03-07 DIAGNOSIS — M5481 Occipital neuralgia: Secondary | ICD-10-CM | POA: Diagnosis not present

## 2019-03-07 DIAGNOSIS — Z5181 Encounter for therapeutic drug level monitoring: Secondary | ICD-10-CM | POA: Diagnosis not present

## 2019-03-07 DIAGNOSIS — M722 Plantar fascial fibromatosis: Secondary | ICD-10-CM | POA: Diagnosis not present

## 2019-04-04 DIAGNOSIS — G43909 Migraine, unspecified, not intractable, without status migrainosus: Secondary | ICD-10-CM | POA: Diagnosis not present

## 2019-04-04 DIAGNOSIS — M722 Plantar fascial fibromatosis: Secondary | ICD-10-CM | POA: Diagnosis not present

## 2019-04-04 DIAGNOSIS — M5481 Occipital neuralgia: Secondary | ICD-10-CM | POA: Diagnosis not present

## 2019-04-04 DIAGNOSIS — Z5181 Encounter for therapeutic drug level monitoring: Secondary | ICD-10-CM | POA: Diagnosis not present

## 2019-04-05 DIAGNOSIS — J069 Acute upper respiratory infection, unspecified: Secondary | ICD-10-CM | POA: Diagnosis not present

## 2019-04-05 DIAGNOSIS — J329 Chronic sinusitis, unspecified: Secondary | ICD-10-CM | POA: Diagnosis not present

## 2019-04-05 DIAGNOSIS — B342 Coronavirus infection, unspecified: Secondary | ICD-10-CM | POA: Diagnosis not present

## 2019-05-02 DIAGNOSIS — M5481 Occipital neuralgia: Secondary | ICD-10-CM | POA: Diagnosis not present

## 2019-05-02 DIAGNOSIS — M722 Plantar fascial fibromatosis: Secondary | ICD-10-CM | POA: Diagnosis not present

## 2019-05-02 DIAGNOSIS — M792 Neuralgia and neuritis, unspecified: Secondary | ICD-10-CM | POA: Diagnosis not present

## 2019-05-02 DIAGNOSIS — G8929 Other chronic pain: Secondary | ICD-10-CM | POA: Diagnosis not present

## 2019-05-02 DIAGNOSIS — Z5181 Encounter for therapeutic drug level monitoring: Secondary | ICD-10-CM | POA: Diagnosis not present

## 2019-05-02 DIAGNOSIS — G43909 Migraine, unspecified, not intractable, without status migrainosus: Secondary | ICD-10-CM | POA: Diagnosis not present

## 2019-05-02 DIAGNOSIS — M48062 Spinal stenosis, lumbar region with neurogenic claudication: Secondary | ICD-10-CM | POA: Diagnosis not present

## 2019-05-02 DIAGNOSIS — M47897 Other spondylosis, lumbosacral region: Secondary | ICD-10-CM | POA: Diagnosis not present

## 2019-05-30 DIAGNOSIS — M5481 Occipital neuralgia: Secondary | ICD-10-CM | POA: Diagnosis not present

## 2019-05-30 DIAGNOSIS — G43909 Migraine, unspecified, not intractable, without status migrainosus: Secondary | ICD-10-CM | POA: Diagnosis not present

## 2019-05-30 DIAGNOSIS — M722 Plantar fascial fibromatosis: Secondary | ICD-10-CM | POA: Diagnosis not present

## 2019-05-30 DIAGNOSIS — Z5181 Encounter for therapeutic drug level monitoring: Secondary | ICD-10-CM | POA: Diagnosis not present

## 2019-05-30 DIAGNOSIS — G894 Chronic pain syndrome: Secondary | ICD-10-CM | POA: Diagnosis not present

## 2019-06-06 DIAGNOSIS — Z947 Corneal transplant status: Secondary | ICD-10-CM | POA: Diagnosis not present

## 2019-06-21 DIAGNOSIS — N390 Urinary tract infection, site not specified: Secondary | ICD-10-CM | POA: Diagnosis not present

## 2019-06-21 DIAGNOSIS — Z720 Tobacco use: Secondary | ICD-10-CM | POA: Diagnosis not present

## 2019-06-21 DIAGNOSIS — F329 Major depressive disorder, single episode, unspecified: Secondary | ICD-10-CM | POA: Diagnosis not present

## 2019-06-22 DIAGNOSIS — F331 Major depressive disorder, recurrent, moderate: Secondary | ICD-10-CM | POA: Diagnosis not present

## 2019-06-22 DIAGNOSIS — F4310 Post-traumatic stress disorder, unspecified: Secondary | ICD-10-CM | POA: Diagnosis not present

## 2019-06-22 DIAGNOSIS — Z634 Disappearance and death of family member: Secondary | ICD-10-CM | POA: Diagnosis not present

## 2019-06-22 DIAGNOSIS — F411 Generalized anxiety disorder: Secondary | ICD-10-CM | POA: Diagnosis not present

## 2019-06-27 DIAGNOSIS — M722 Plantar fascial fibromatosis: Secondary | ICD-10-CM | POA: Diagnosis not present

## 2019-06-27 DIAGNOSIS — Z5181 Encounter for therapeutic drug level monitoring: Secondary | ICD-10-CM | POA: Diagnosis not present

## 2019-06-27 DIAGNOSIS — M5481 Occipital neuralgia: Secondary | ICD-10-CM | POA: Diagnosis not present

## 2019-06-27 DIAGNOSIS — G43909 Migraine, unspecified, not intractable, without status migrainosus: Secondary | ICD-10-CM | POA: Diagnosis not present

## 2019-07-25 DIAGNOSIS — G8929 Other chronic pain: Secondary | ICD-10-CM | POA: Diagnosis not present

## 2019-07-25 DIAGNOSIS — Z5181 Encounter for therapeutic drug level monitoring: Secondary | ICD-10-CM | POA: Diagnosis not present

## 2019-07-25 DIAGNOSIS — M48062 Spinal stenosis, lumbar region with neurogenic claudication: Secondary | ICD-10-CM | POA: Diagnosis not present

## 2019-07-25 DIAGNOSIS — G43909 Migraine, unspecified, not intractable, without status migrainosus: Secondary | ICD-10-CM | POA: Diagnosis not present

## 2019-07-25 DIAGNOSIS — M792 Neuralgia and neuritis, unspecified: Secondary | ICD-10-CM | POA: Diagnosis not present

## 2019-07-25 DIAGNOSIS — M722 Plantar fascial fibromatosis: Secondary | ICD-10-CM | POA: Diagnosis not present

## 2019-07-25 DIAGNOSIS — M47897 Other spondylosis, lumbosacral region: Secondary | ICD-10-CM | POA: Diagnosis not present

## 2019-07-25 DIAGNOSIS — M5481 Occipital neuralgia: Secondary | ICD-10-CM | POA: Diagnosis not present

## 2019-08-01 ENCOUNTER — Telehealth: Payer: Self-pay | Admitting: Orthopedic Surgery

## 2019-08-01 NOTE — Telephone Encounter (Signed)
Call received from patient, inquiring about appointment for work-related injury of foot; states it occurred 09/19/19 - employer is [employer]. Relays has been seen by provider at [facility], [address] [place], twice. Latricia Heft were done there. Discussed worker's comp protocol; appointment pending information needed as to approval by [employer], claim number, adjuster contact information.

## 2019-08-02 ENCOUNTER — Encounter: Payer: Self-pay | Admitting: Family Medicine

## 2019-08-02 ENCOUNTER — Other Ambulatory Visit: Payer: Self-pay

## 2019-08-02 ENCOUNTER — Ambulatory Visit (INDEPENDENT_AMBULATORY_CARE_PROVIDER_SITE_OTHER): Payer: BC Managed Care – PPO | Admitting: Family Medicine

## 2019-08-02 ENCOUNTER — Encounter (INDEPENDENT_AMBULATORY_CARE_PROVIDER_SITE_OTHER): Payer: Self-pay

## 2019-08-02 VITALS — BP 107/78 | HR 76 | Temp 98.3°F | Resp 12 | Ht 64.0 in | Wt 157.0 lb

## 2019-08-02 DIAGNOSIS — J029 Acute pharyngitis, unspecified: Secondary | ICD-10-CM

## 2019-08-02 DIAGNOSIS — F17210 Nicotine dependence, cigarettes, uncomplicated: Secondary | ICD-10-CM | POA: Diagnosis not present

## 2019-08-02 DIAGNOSIS — Z20822 Contact with and (suspected) exposure to covid-19: Secondary | ICD-10-CM

## 2019-08-02 DIAGNOSIS — Z7689 Persons encountering health services in other specified circumstances: Secondary | ICD-10-CM

## 2019-08-02 DIAGNOSIS — M79671 Pain in right foot: Secondary | ICD-10-CM

## 2019-08-02 DIAGNOSIS — E663 Overweight: Secondary | ICD-10-CM

## 2019-08-02 DIAGNOSIS — M5136 Other intervertebral disc degeneration, lumbar region: Secondary | ICD-10-CM | POA: Diagnosis not present

## 2019-08-02 DIAGNOSIS — M51369 Other intervertebral disc degeneration, lumbar region without mention of lumbar back pain or lower extremity pain: Secondary | ICD-10-CM

## 2019-08-02 NOTE — Patient Instructions (Addendum)
    Thank you for coming into the office today. I appreciate the opportunity to provide you with the care for your health and wellness. Today we discussed: follow up 5 weeks  No labs or referrals   We will look to get a CPE with pap in the new year.  Work to reduced smoking. Smoke one less cigarette a day.  Continue to walk daily as you can with foot.  Continue to eat a well balanced diet.  Please continue to practice social distancing to keep you, your family, and our community safe.  If you must go out, please wear a Mask and practice good handwashing.  [place] YOUR HANDS WELL AND FREQUENTLY. AVOID TOUCHING YOUR FACE, UNLESS YOUR HANDS ARE FRESHLY WASHED.  GET FRESH AIR DAILY. STAY HYDRATED WITH WATER.   It was a pleasure to see you and I look forward to continuing to work together on your health and well-being. Please do not hesitate to call the office if you need care or have questions about your care.  Have a wonderful day and week. With Gratitude, Cherly Beach, DNP, AGNP-BC
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
## 2019-08-03 LAB — NOVEL CORONAVIRUS, NAA: SARS-CoV-2, NAA: NOT DETECTED

## 2019-08-09 NOTE — Telephone Encounter (Signed)
No further response received.

## 2019-08-15 DIAGNOSIS — M79671 Pain in right foot: Secondary | ICD-10-CM | POA: Diagnosis not present

## 2019-08-15 DIAGNOSIS — S92301A Fracture of unspecified metatarsal bone(s), right foot, initial encounter for closed fracture: Secondary | ICD-10-CM | POA: Diagnosis not present

## 2019-08-23 DIAGNOSIS — G894 Chronic pain syndrome: Secondary | ICD-10-CM | POA: Diagnosis not present

## 2019-08-23 DIAGNOSIS — G43909 Migraine, unspecified, not intractable, without status migrainosus: Secondary | ICD-10-CM | POA: Diagnosis not present

## 2019-08-23 DIAGNOSIS — M5481 Occipital neuralgia: Secondary | ICD-10-CM | POA: Diagnosis not present

## 2019-08-23 DIAGNOSIS — M722 Plantar fascial fibromatosis: Secondary | ICD-10-CM | POA: Diagnosis not present

## 2019-08-23 DIAGNOSIS — Z5181 Encounter for therapeutic drug level monitoring: Secondary | ICD-10-CM | POA: Diagnosis not present

## 2019-09-02 DIAGNOSIS — H1820 Unspecified corneal edema: Secondary | ICD-10-CM | POA: Diagnosis not present

## 2019-09-02 DIAGNOSIS — H40041 Steroid responder, right eye: Secondary | ICD-10-CM | POA: Diagnosis not present

## 2019-09-02 DIAGNOSIS — Z961 Presence of intraocular lens: Secondary | ICD-10-CM | POA: Diagnosis not present

## 2019-09-02 DIAGNOSIS — Z947 Corneal transplant status: Secondary | ICD-10-CM | POA: Diagnosis not present

## 2019-09-06 ENCOUNTER — Ambulatory Visit: Payer: BC Managed Care – PPO | Admitting: Family Medicine

## 2019-09-06 ENCOUNTER — Other Ambulatory Visit: Payer: Self-pay

## 2019-09-12 DIAGNOSIS — M79671 Pain in right foot: Secondary | ICD-10-CM | POA: Diagnosis not present

## 2019-09-12 DIAGNOSIS — S92301A Fracture of unspecified metatarsal bone(s), right foot, initial encounter for closed fracture: Secondary | ICD-10-CM | POA: Diagnosis not present

## 2019-09-19 DIAGNOSIS — M5481 Occipital neuralgia: Secondary | ICD-10-CM | POA: Diagnosis not present

## 2019-09-19 DIAGNOSIS — Z5181 Encounter for therapeutic drug level monitoring: Secondary | ICD-10-CM | POA: Diagnosis not present

## 2019-09-19 DIAGNOSIS — M722 Plantar fascial fibromatosis: Secondary | ICD-10-CM | POA: Diagnosis not present

## 2019-09-19 DIAGNOSIS — G43909 Migraine, unspecified, not intractable, without status migrainosus: Secondary | ICD-10-CM | POA: Diagnosis not present

## 2019-10-17 DIAGNOSIS — M5481 Occipital neuralgia: Secondary | ICD-10-CM | POA: Diagnosis not present

## 2019-10-17 DIAGNOSIS — G894 Chronic pain syndrome: Secondary | ICD-10-CM | POA: Diagnosis not present

## 2019-10-17 DIAGNOSIS — M5136 Other intervertebral disc degeneration, lumbar region: Secondary | ICD-10-CM | POA: Diagnosis not present

## 2019-10-17 DIAGNOSIS — M545 Low back pain: Secondary | ICD-10-CM | POA: Diagnosis not present

## 2019-11-14 DIAGNOSIS — G894 Chronic pain syndrome: Secondary | ICD-10-CM | POA: Diagnosis not present

## 2019-11-14 DIAGNOSIS — M545 Low back pain: Secondary | ICD-10-CM | POA: Diagnosis not present

## 2019-11-14 DIAGNOSIS — M5136 Other intervertebral disc degeneration, lumbar region: Secondary | ICD-10-CM | POA: Diagnosis not present

## 2019-11-14 DIAGNOSIS — M5481 Occipital neuralgia: Secondary | ICD-10-CM | POA: Diagnosis not present

## 2019-11-14 DIAGNOSIS — G43909 Migraine, unspecified, not intractable, without status migrainosus: Secondary | ICD-10-CM | POA: Diagnosis not present

## 2019-12-12 DIAGNOSIS — G894 Chronic pain syndrome: Secondary | ICD-10-CM | POA: Diagnosis not present

## 2020-01-09 DIAGNOSIS — G894 Chronic pain syndrome: Secondary | ICD-10-CM | POA: Diagnosis not present

## 2020-01-09 DIAGNOSIS — M259 Joint disorder, unspecified: Secondary | ICD-10-CM | POA: Diagnosis not present

## 2020-01-09 DIAGNOSIS — M179 Osteoarthritis of knee, unspecified: Secondary | ICD-10-CM | POA: Diagnosis not present

## 2020-01-09 DIAGNOSIS — M545 Low back pain: Secondary | ICD-10-CM | POA: Diagnosis not present

## 2020-02-06 DIAGNOSIS — G894 Chronic pain syndrome: Secondary | ICD-10-CM | POA: Diagnosis not present

## 2020-02-06 DIAGNOSIS — G43909 Migraine, unspecified, not intractable, without status migrainosus: Secondary | ICD-10-CM | POA: Diagnosis not present

## 2020-02-06 DIAGNOSIS — Z5181 Encounter for therapeutic drug level monitoring: Secondary | ICD-10-CM | POA: Diagnosis not present

## 2020-02-06 DIAGNOSIS — M5481 Occipital neuralgia: Secondary | ICD-10-CM | POA: Diagnosis not present

## 2020-02-06 DIAGNOSIS — M5136 Other intervertebral disc degeneration, lumbar region: Secondary | ICD-10-CM | POA: Diagnosis not present

## 2020-02-06 DIAGNOSIS — M722 Plantar fascial fibromatosis: Secondary | ICD-10-CM | POA: Diagnosis not present

## 2020-02-06 DIAGNOSIS — Z79891 Long term (current) use of opiate analgesic: Secondary | ICD-10-CM | POA: Diagnosis not present

## 2020-02-23 ENCOUNTER — Telehealth: Payer: Self-pay | Admitting: Obstetrics and Gynecology

## 2020-02-23 NOTE — Telephone Encounter (Signed)

## 2020-02-24 ENCOUNTER — Other Ambulatory Visit: Payer: Self-pay

## 2020-02-24 ENCOUNTER — Ambulatory Visit: Payer: BC Managed Care – PPO | Admitting: Obstetrics and Gynecology

## 2020-02-24 ENCOUNTER — Encounter: Payer: Self-pay | Admitting: Obstetrics and Gynecology

## 2020-02-24 VITALS — BP 98/67 | HR 80 | Ht 63.0 in | Wt 163.0 lb

## 2020-02-24 DIAGNOSIS — K629 Disease of anus and rectum, unspecified: Secondary | ICD-10-CM | POA: Diagnosis not present

## 2020-02-24 DIAGNOSIS — L720 Epidermal cyst: Secondary | ICD-10-CM

## 2020-02-24 DIAGNOSIS — A630 Anogenital (venereal) warts: Secondary | ICD-10-CM | POA: Diagnosis not present
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
## 2020-03-05 DIAGNOSIS — G8929 Other chronic pain: Secondary | ICD-10-CM | POA: Diagnosis not present

## 2020-03-05 DIAGNOSIS — M48062 Spinal stenosis, lumbar region with neurogenic claudication: Secondary | ICD-10-CM | POA: Diagnosis not present

## 2020-03-05 DIAGNOSIS — F419 Anxiety disorder, unspecified: Secondary | ICD-10-CM | POA: Diagnosis not present

## 2020-03-05 DIAGNOSIS — M722 Plantar fascial fibromatosis: Secondary | ICD-10-CM | POA: Diagnosis not present

## 2020-03-05 DIAGNOSIS — M25519 Pain in unspecified shoulder: Secondary | ICD-10-CM | POA: Diagnosis not present

## 2020-03-05 DIAGNOSIS — G894 Chronic pain syndrome: Secondary | ICD-10-CM | POA: Diagnosis not present

## 2020-03-05 DIAGNOSIS — G43909 Migraine, unspecified, not intractable, without status migrainosus: Secondary | ICD-10-CM | POA: Diagnosis not present

## 2020-03-05 DIAGNOSIS — M5481 Occipital neuralgia: Secondary | ICD-10-CM | POA: Diagnosis not present

## 2020-03-05 DIAGNOSIS — M5136 Other intervertebral disc degeneration, lumbar region: Secondary | ICD-10-CM | POA: Diagnosis not present

## 2020-03-05 DIAGNOSIS — I119 Hypertensive heart disease without heart failure: Secondary | ICD-10-CM | POA: Diagnosis not present

## 2020-03-05 DIAGNOSIS — M79609 Pain in unspecified limb: Secondary | ICD-10-CM | POA: Diagnosis not present

## 2020-03-07 DIAGNOSIS — H168 Other keratitis: Secondary | ICD-10-CM | POA: Diagnosis not present

## 2020-03-07 DIAGNOSIS — Z8669 Personal history of other diseases of the nervous system and sense organs: Secondary | ICD-10-CM | POA: Diagnosis not present

## 2020-03-07 DIAGNOSIS — Z947 Corneal transplant status: Secondary | ICD-10-CM | POA: Diagnosis not present

## 2020-03-07 DIAGNOSIS — Z961 Presence of intraocular lens: Secondary | ICD-10-CM | POA: Diagnosis not present

## 2020-03-19 ENCOUNTER — Telehealth: Payer: Self-pay | Admitting: Obstetrics and Gynecology

## 2020-03-19 NOTE — Telephone Encounter (Signed)
Tried to reach the patient to remind her of her appointment/restrictions, mailbox is full.

## 2020-03-20 ENCOUNTER — Other Ambulatory Visit: Payer: Self-pay

## 2020-03-20 ENCOUNTER — Encounter: Payer: Self-pay | Admitting: Obstetrics and Gynecology

## 2020-03-20 ENCOUNTER — Other Ambulatory Visit (HOSPITAL_COMMUNITY)
Admission: RE | Admit: 2020-03-20 | Discharge: 2020-03-20 | Disposition: A | Discharge status: Home / Self Care | Payer: BC Managed Care – PPO | Source: Ambulatory Visit | Attending: Obstetrics and Gynecology | Admitting: Obstetrics and Gynecology

## 2020-03-20 ENCOUNTER — Ambulatory Visit: Payer: BC Managed Care – PPO | Admitting: Obstetrics and Gynecology

## 2020-03-20 ENCOUNTER — Other Ambulatory Visit: Payer: Self-pay | Admitting: Obstetrics and Gynecology

## 2020-03-20 VITALS — BP 107/63 | HR 83 | Ht 63.0 in | Wt 162.6 lb

## 2020-03-20 DIAGNOSIS — Z113 Encounter for screening for infections with a predominantly sexual mode of transmission: Secondary | ICD-10-CM | POA: Insufficient documentation

## 2020-03-20 DIAGNOSIS — D069 Carcinoma in situ of cervix, unspecified: Secondary | ICD-10-CM | POA: Diagnosis not present

## 2020-03-20 DIAGNOSIS — K629 Disease of anus and rectum, unspecified: Secondary | ICD-10-CM | POA: Diagnosis not present

## 2020-03-20 MED ORDER — CEPHALEXIN 500 MG PO CAPS: 500.0000 mg | ORAL_CAPSULE | Freq: Four times a day (QID) | ORAL | 3 refills | Status: DC
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
## 2020-03-20 NOTE — Addendum Note (Signed)
Addended by: Armond Hang on: 03/20/2020 04:34 PM   Modules accepted: Orders

## 2020-03-22 LAB — CERVICOVAGINAL ANCILLARY ONLY
Chlamydia: NEGATIVE
Comment: NEGATIVE
Comment: NEGATIVE
Comment: NORMAL
Neisseria Gonorrhea: NEGATIVE
Trichomonas: NEGATIVE

## 2020-03-23 NOTE — Progress Notes (Signed)
VIN III, clear margins.

## 2020-03-26 ENCOUNTER — Telehealth: Payer: Self-pay | Admitting: *Deleted

## 2020-03-26 NOTE — Telephone Encounter (Signed)
Attempted to reach patient regarding pathology report, VM full.

## 2020-04-02 ENCOUNTER — Telehealth: Payer: Self-pay | Admitting: Obstetrics and Gynecology

## 2020-04-02 DIAGNOSIS — G894 Chronic pain syndrome: Secondary | ICD-10-CM | POA: Diagnosis not present

## 2020-04-02 DIAGNOSIS — M179 Osteoarthritis of knee, unspecified: Secondary | ICD-10-CM | POA: Diagnosis not present

## 2020-04-02 DIAGNOSIS — M5136 Other intervertebral disc degeneration, lumbar region: Secondary | ICD-10-CM | POA: Diagnosis not present

## 2020-04-02 DIAGNOSIS — M545 Low back pain: Secondary | ICD-10-CM | POA: Diagnosis not present

## 2020-04-02 NOTE — Telephone Encounter (Signed)

## 2020-04-03 ENCOUNTER — Other Ambulatory Visit: Payer: Self-pay

## 2020-04-03 ENCOUNTER — Encounter: Payer: Self-pay | Admitting: Obstetrics and Gynecology

## 2020-04-03 ENCOUNTER — Ambulatory Visit: Payer: BC Managed Care – PPO | Admitting: Obstetrics and Gynecology

## 2020-04-03 VITALS — BP 121/81 | HR 82 | Wt 165.8 lb

## 2020-04-03 DIAGNOSIS — Z4802 Encounter for removal of sutures: Secondary | ICD-10-CM
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
## 2020-04-30 DIAGNOSIS — M545 Low back pain: Secondary | ICD-10-CM | POA: Diagnosis not present

## 2020-04-30 DIAGNOSIS — G894 Chronic pain syndrome: Secondary | ICD-10-CM | POA: Diagnosis not present

## 2020-04-30 DIAGNOSIS — G43909 Migraine, unspecified, not intractable, without status migrainosus: Secondary | ICD-10-CM | POA: Diagnosis not present

## 2020-05-28 DIAGNOSIS — G894 Chronic pain syndrome: Secondary | ICD-10-CM | POA: Diagnosis not present

## 2020-05-28 DIAGNOSIS — M5136 Other intervertebral disc degeneration, lumbar region: Secondary | ICD-10-CM | POA: Diagnosis not present

## 2020-05-28 DIAGNOSIS — M545 Low back pain: Secondary | ICD-10-CM | POA: Diagnosis not present

## 2020-06-25 DIAGNOSIS — G43909 Migraine, unspecified, not intractable, without status migrainosus: Secondary | ICD-10-CM | POA: Diagnosis not present

## 2020-06-25 DIAGNOSIS — M25519 Pain in unspecified shoulder: Secondary | ICD-10-CM | POA: Diagnosis not present

## 2020-06-25 DIAGNOSIS — M9931 Osseous stenosis of neural canal of cervical region: Secondary | ICD-10-CM | POA: Diagnosis not present

## 2020-06-25 DIAGNOSIS — M5136 Other intervertebral disc degeneration, lumbar region: Secondary | ICD-10-CM | POA: Diagnosis not present

## 2020-06-25 DIAGNOSIS — M722 Plantar fascial fibromatosis: Secondary | ICD-10-CM | POA: Diagnosis not present

## 2020-06-25 DIAGNOSIS — M545 Low back pain: Secondary | ICD-10-CM | POA: Diagnosis not present

## 2020-06-25 DIAGNOSIS — M5481 Occipital neuralgia: Secondary | ICD-10-CM | POA: Diagnosis not present

## 2020-06-25 DIAGNOSIS — M79609 Pain in unspecified limb: Secondary | ICD-10-CM | POA: Diagnosis not present

## 2020-06-25 DIAGNOSIS — M9951 Intervertebral disc stenosis of neural canal of cervical region: Secondary | ICD-10-CM | POA: Diagnosis not present

## 2020-06-25 DIAGNOSIS — I119 Hypertensive heart disease without heart failure: Secondary | ICD-10-CM | POA: Diagnosis not present

## 2020-06-25 DIAGNOSIS — M47897 Other spondylosis, lumbosacral region: Secondary | ICD-10-CM | POA: Diagnosis not present

## 2020-06-25 DIAGNOSIS — M792 Neuralgia and neuritis, unspecified: Secondary | ICD-10-CM | POA: Diagnosis not present

## 2020-06-25 DIAGNOSIS — M48062 Spinal stenosis, lumbar region with neurogenic claudication: Secondary | ICD-10-CM | POA: Diagnosis not present

## 2020-06-25 DIAGNOSIS — G8929 Other chronic pain: Secondary | ICD-10-CM | POA: Diagnosis not present

## 2020-07-18 DIAGNOSIS — L720 Epidermal cyst: Secondary | ICD-10-CM | POA: Diagnosis not present

## 2020-07-23 DIAGNOSIS — G894 Chronic pain syndrome: Secondary | ICD-10-CM | POA: Diagnosis not present

## 2020-08-06 DIAGNOSIS — Z8669 Personal history of other diseases of the nervous system and sense organs: Secondary | ICD-10-CM | POA: Diagnosis not present

## 2020-08-06 DIAGNOSIS — Z947 Corneal transplant status: Secondary | ICD-10-CM | POA: Diagnosis not present

## 2020-08-06 DIAGNOSIS — H168 Other keratitis: Secondary | ICD-10-CM | POA: Diagnosis not present

## 2020-08-06 DIAGNOSIS — Z961 Presence of intraocular lens: Secondary | ICD-10-CM | POA: Diagnosis not present

## 2020-08-21 DIAGNOSIS — M545 Low back pain: Secondary | ICD-10-CM | POA: Diagnosis not present

## 2020-08-21 DIAGNOSIS — M5136 Other intervertebral disc degeneration, lumbar region: Secondary | ICD-10-CM | POA: Diagnosis not present

## 2020-08-21 DIAGNOSIS — G894 Chronic pain syndrome: Secondary | ICD-10-CM | POA: Diagnosis not present

## 2020-08-21 DIAGNOSIS — M722 Plantar fascial fibromatosis: Secondary | ICD-10-CM | POA: Diagnosis not present

## 2020-08-21 DIAGNOSIS — M5481 Occipital neuralgia: Secondary | ICD-10-CM | POA: Diagnosis not present

## 2020-09-17 DIAGNOSIS — M5136 Other intervertebral disc degeneration, lumbar region: Secondary | ICD-10-CM | POA: Diagnosis not present

## 2020-09-17 DIAGNOSIS — M179 Osteoarthritis of knee, unspecified: Secondary | ICD-10-CM | POA: Diagnosis not present

## 2020-09-17 DIAGNOSIS — M722 Plantar fascial fibromatosis: Secondary | ICD-10-CM | POA: Diagnosis not present

## 2020-10-15 DIAGNOSIS — F419 Anxiety disorder, unspecified: Secondary | ICD-10-CM | POA: Diagnosis not present

## 2020-10-15 DIAGNOSIS — M48062 Spinal stenosis, lumbar region with neurogenic claudication: Secondary | ICD-10-CM | POA: Diagnosis not present

## 2020-10-15 DIAGNOSIS — M4726 Other spondylosis with radiculopathy, lumbar region: Secondary | ICD-10-CM | POA: Diagnosis not present

## 2020-10-15 DIAGNOSIS — M5481 Occipital neuralgia: Secondary | ICD-10-CM | POA: Diagnosis not present

## 2020-10-15 DIAGNOSIS — M4807 Spinal stenosis, lumbosacral region: Secondary | ICD-10-CM | POA: Diagnosis not present

## 2020-10-15 DIAGNOSIS — G8929 Other chronic pain: Secondary | ICD-10-CM | POA: Diagnosis not present

## 2020-10-15 DIAGNOSIS — M722 Plantar fascial fibromatosis: Secondary | ICD-10-CM | POA: Diagnosis not present

## 2020-10-15 DIAGNOSIS — M79609 Pain in unspecified limb: Secondary | ICD-10-CM | POA: Diagnosis not present

## 2020-10-15 DIAGNOSIS — I119 Hypertensive heart disease without heart failure: Secondary | ICD-10-CM | POA: Diagnosis not present

## 2020-10-15 DIAGNOSIS — M179 Osteoarthritis of knee, unspecified: Secondary | ICD-10-CM | POA: Diagnosis not present

## 2020-10-15 DIAGNOSIS — M792 Neuralgia and neuritis, unspecified: Secondary | ICD-10-CM | POA: Diagnosis not present

## 2020-10-15 DIAGNOSIS — M25559 Pain in unspecified hip: Secondary | ICD-10-CM | POA: Diagnosis not present

## 2020-10-15 DIAGNOSIS — M5136 Other intervertebral disc degeneration, lumbar region: Secondary | ICD-10-CM | POA: Diagnosis not present

## 2020-10-15 DIAGNOSIS — M25519 Pain in unspecified shoulder: Secondary | ICD-10-CM | POA: Diagnosis not present

## 2020-10-15 DIAGNOSIS — M47897 Other spondylosis, lumbosacral region: Secondary | ICD-10-CM | POA: Diagnosis not present

## 2020-11-01 ENCOUNTER — Other Ambulatory Visit: Payer: Self-pay

## 2020-11-01 ENCOUNTER — Other Ambulatory Visit: Payer: BC Managed Care – PPO

## 2020-11-01 DIAGNOSIS — Z20822 Contact with and (suspected) exposure to covid-19: Secondary | ICD-10-CM | POA: Diagnosis not present

## 2020-11-02 LAB — SPECIMEN STATUS REPORT

## 2020-11-02 LAB — NOVEL CORONAVIRUS, NAA: SARS-CoV-2, NAA: NOT DETECTED

## 2020-11-02 LAB — SARS-COV-2, NAA 2 DAY TAT

## 2020-11-12 DIAGNOSIS — M5136 Other intervertebral disc degeneration, lumbar region: Secondary | ICD-10-CM | POA: Diagnosis not present

## 2020-11-12 DIAGNOSIS — M259 Joint disorder, unspecified: Secondary | ICD-10-CM | POA: Diagnosis not present

## 2020-11-12 DIAGNOSIS — M5481 Occipital neuralgia: Secondary | ICD-10-CM | POA: Diagnosis not present

## 2020-11-12 DIAGNOSIS — M722 Plantar fascial fibromatosis: Secondary | ICD-10-CM | POA: Diagnosis not present

## 2020-12-05 DIAGNOSIS — M5136 Other intervertebral disc degeneration, lumbar region: Secondary | ICD-10-CM | POA: Diagnosis not present

## 2020-12-05 DIAGNOSIS — M259 Joint disorder, unspecified: Secondary | ICD-10-CM | POA: Diagnosis not present

## 2020-12-05 DIAGNOSIS — M722 Plantar fascial fibromatosis: Secondary | ICD-10-CM | POA: Diagnosis not present

## 2020-12-05 DIAGNOSIS — M5481 Occipital neuralgia: Secondary | ICD-10-CM | POA: Diagnosis not present

## 2021-01-03 DIAGNOSIS — M5481 Occipital neuralgia: Secondary | ICD-10-CM | POA: Diagnosis not present

## 2021-01-03 DIAGNOSIS — M5136 Other intervertebral disc degeneration, lumbar region: Secondary | ICD-10-CM | POA: Diagnosis not present

## 2021-01-03 DIAGNOSIS — G43909 Migraine, unspecified, not intractable, without status migrainosus: Secondary | ICD-10-CM | POA: Diagnosis not present

## 2021-01-03 DIAGNOSIS — G894 Chronic pain syndrome: Secondary | ICD-10-CM | POA: Diagnosis not present

## 2021-01-03 DIAGNOSIS — M722 Plantar fascial fibromatosis: Secondary | ICD-10-CM | POA: Diagnosis not present

## 2021-01-07 ENCOUNTER — Telehealth (INDEPENDENT_AMBULATORY_CARE_PROVIDER_SITE_OTHER): Payer: BC Managed Care – PPO | Admitting: Nurse Practitioner

## 2021-01-07 ENCOUNTER — Other Ambulatory Visit: Payer: Self-pay

## 2021-01-07 ENCOUNTER — Encounter: Payer: Self-pay | Admitting: Nurse Practitioner

## 2021-01-07 DIAGNOSIS — J40 Bronchitis, not specified as acute or chronic: Secondary | ICD-10-CM | POA: Diagnosis not present

## 2021-01-07 MED ORDER — AMOXICILLIN-POT CLAVULANATE 875-125 MG PO TABS: 1.0000 | ORAL_TABLET | Freq: Two times a day (BID) | ORAL | 0 refills | Status: AC

## 2021-01-07 MED ORDER — ALBUTEROL SULFATE HFA 108 (90 BASE) MCG/ACT IN AERS: 2.0000 | INHALATION_SPRAY | Freq: Four times a day (QID) | RESPIRATORY_TRACT | 0 refills | Status: DC | PRN

## 2021-01-07 MED ORDER — PROMETHAZINE-DM 6.25-15 MG/5ML PO SYRP: 5.0000 mL | ORAL_SOLUTION | Freq: Four times a day (QID) | ORAL | 0 refills | Status: AC | PRN
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
## 2021-01-07 NOTE — Assessment & Plan Note (Signed)
-  Rx Augmentin -Rx. Albuterol -Rx. Promethazine-DM cough syrup

## 2021-01-29 ENCOUNTER — Other Ambulatory Visit: Payer: Self-pay | Admitting: Nurse Practitioner

## 2021-01-30 DIAGNOSIS — M792 Neuralgia and neuritis, unspecified: Secondary | ICD-10-CM | POA: Diagnosis not present

## 2021-01-30 DIAGNOSIS — M25519 Pain in unspecified shoulder: Secondary | ICD-10-CM | POA: Diagnosis not present

## 2021-01-30 DIAGNOSIS — G894 Chronic pain syndrome: Secondary | ICD-10-CM | POA: Diagnosis not present

## 2021-01-30 DIAGNOSIS — M9943 Connective tissue stenosis of neural canal of lumbar region: Secondary | ICD-10-CM | POA: Diagnosis not present

## 2021-01-30 DIAGNOSIS — G8929 Other chronic pain: Secondary | ICD-10-CM | POA: Diagnosis not present

## 2021-01-30 DIAGNOSIS — M25559 Pain in unspecified hip: Secondary | ICD-10-CM | POA: Diagnosis not present

## 2021-01-30 DIAGNOSIS — R519 Headache, unspecified: Secondary | ICD-10-CM | POA: Diagnosis not present

## 2021-01-30 DIAGNOSIS — M48062 Spinal stenosis, lumbar region with neurogenic claudication: Secondary | ICD-10-CM | POA: Diagnosis not present

## 2021-01-30 DIAGNOSIS — M79609 Pain in unspecified limb: Secondary | ICD-10-CM | POA: Diagnosis not present

## 2021-01-30 DIAGNOSIS — M9973 Connective tissue and disc stenosis of intervertebral foramina of lumbar region: Secondary | ICD-10-CM | POA: Diagnosis not present

## 2021-01-30 DIAGNOSIS — M4807 Spinal stenosis, lumbosacral region: Secondary | ICD-10-CM | POA: Diagnosis not present

## 2021-01-30 DIAGNOSIS — I119 Hypertensive heart disease without heart failure: Secondary | ICD-10-CM | POA: Diagnosis not present

## 2021-02-26 DIAGNOSIS — G894 Chronic pain syndrome: Secondary | ICD-10-CM | POA: Diagnosis not present

## 2021-02-27 DIAGNOSIS — H18232 Secondary corneal edema, left eye: Secondary | ICD-10-CM | POA: Diagnosis not present

## 2021-02-27 DIAGNOSIS — Z947 Corneal transplant status: Secondary | ICD-10-CM | POA: Diagnosis not present

## 2021-03-11 ENCOUNTER — Telehealth: Payer: Self-pay

## 2021-03-11 NOTE — Telephone Encounter (Signed)
LVM for the pt to call and schedule appt

## 2021-03-11 NOTE — Telephone Encounter (Signed)
-----   Message from Noreene Larsson, NP sent at 03/07/2021  4:54 PM EDT ----- Her insurance sent [place] a letter that says that she is picking up albuterol frequently without having a maintenance medicine for asthma. Would be be interested in coming in and having a physical and discussing asthma control options?  She hasn't had a recent physical, so we can get same-day fasting labs if that is convenient for her.  -MG

## 2021-03-26 DIAGNOSIS — G894 Chronic pain syndrome: Secondary | ICD-10-CM | POA: Diagnosis not present

## 2021-04-16 DIAGNOSIS — G894 Chronic pain syndrome: Secondary | ICD-10-CM | POA: Diagnosis not present

## 2021-05-15 DIAGNOSIS — M48062 Spinal stenosis, lumbar region with neurogenic claudication: Secondary | ICD-10-CM | POA: Diagnosis not present

## 2021-05-15 DIAGNOSIS — G894 Chronic pain syndrome: Secondary | ICD-10-CM | POA: Diagnosis not present

## 2021-05-15 DIAGNOSIS — M79609 Pain in unspecified limb: Secondary | ICD-10-CM | POA: Diagnosis not present

## 2021-05-15 DIAGNOSIS — M4726 Other spondylosis with radiculopathy, lumbar region: Secondary | ICD-10-CM | POA: Diagnosis not present

## 2021-05-15 DIAGNOSIS — R519 Headache, unspecified: Secondary | ICD-10-CM | POA: Diagnosis not present

## 2021-05-15 DIAGNOSIS — M545 Low back pain, unspecified: Secondary | ICD-10-CM | POA: Diagnosis not present

## 2021-05-15 DIAGNOSIS — M4807 Spinal stenosis, lumbosacral region: Secondary | ICD-10-CM | POA: Diagnosis not present

## 2021-05-15 DIAGNOSIS — M722 Plantar fascial fibromatosis: Secondary | ICD-10-CM | POA: Diagnosis not present

## 2021-05-15 DIAGNOSIS — M792 Neuralgia and neuritis, unspecified: Secondary | ICD-10-CM | POA: Diagnosis not present

## 2021-05-15 DIAGNOSIS — M5136 Other intervertebral disc degeneration, lumbar region: Secondary | ICD-10-CM | POA: Diagnosis not present

## 2021-05-15 DIAGNOSIS — G8929 Other chronic pain: Secondary | ICD-10-CM | POA: Diagnosis not present

## 2021-05-15 DIAGNOSIS — M47897 Other spondylosis, lumbosacral region: Secondary | ICD-10-CM | POA: Diagnosis not present

## 2021-05-15 DIAGNOSIS — M25559 Pain in unspecified hip: Secondary | ICD-10-CM | POA: Diagnosis not present

## 2021-06-12 DIAGNOSIS — M545 Low back pain, unspecified: Secondary | ICD-10-CM | POA: Diagnosis not present

## 2021-06-12 DIAGNOSIS — M5136 Other intervertebral disc degeneration, lumbar region: Secondary | ICD-10-CM | POA: Diagnosis not present

## 2021-06-12 DIAGNOSIS — M179 Osteoarthritis of knee, unspecified: Secondary | ICD-10-CM | POA: Diagnosis not present

## 2021-06-12 DIAGNOSIS — G894 Chronic pain syndrome: Secondary | ICD-10-CM | POA: Diagnosis not present

## 2021-07-15 DIAGNOSIS — G894 Chronic pain syndrome: Secondary | ICD-10-CM | POA: Diagnosis not present

## 2021-07-15 DIAGNOSIS — M5136 Other intervertebral disc degeneration, lumbar region: Secondary | ICD-10-CM | POA: Diagnosis not present

## 2021-07-15 DIAGNOSIS — M179 Osteoarthritis of knee, unspecified: Secondary | ICD-10-CM | POA: Diagnosis not present

## 2021-07-15 DIAGNOSIS — M545 Low back pain, unspecified: Secondary | ICD-10-CM | POA: Diagnosis not present

## 2021-07-29 DIAGNOSIS — U071 COVID-19: Secondary | ICD-10-CM | POA: Diagnosis not present

## 2021-07-29 DIAGNOSIS — J40 Bronchitis, not specified as acute or chronic: Secondary | ICD-10-CM | POA: Diagnosis not present

## 2021-07-29 DIAGNOSIS — K529 Noninfective gastroenteritis and colitis, unspecified: Secondary | ICD-10-CM | POA: Diagnosis not present

## 2021-07-30 ENCOUNTER — Telehealth: Payer: BC Managed Care – PPO | Admitting: Nurse Practitioner

## 2021-08-07 DIAGNOSIS — G894 Chronic pain syndrome: Secondary | ICD-10-CM | POA: Diagnosis not present

## 2021-08-07 DIAGNOSIS — M545 Low back pain, unspecified: Secondary | ICD-10-CM | POA: Diagnosis not present

## 2021-08-07 DIAGNOSIS — M259 Joint disorder, unspecified: Secondary | ICD-10-CM | POA: Diagnosis not present

## 2021-08-07 DIAGNOSIS — M179 Osteoarthritis of knee, unspecified: Secondary | ICD-10-CM | POA: Diagnosis not present

## 2021-09-10 DIAGNOSIS — M47897 Other spondylosis, lumbosacral region: Secondary | ICD-10-CM | POA: Diagnosis not present

## 2021-09-10 DIAGNOSIS — M259 Joint disorder, unspecified: Secondary | ICD-10-CM | POA: Diagnosis not present

## 2021-09-10 DIAGNOSIS — M25519 Pain in unspecified shoulder: Secondary | ICD-10-CM | POA: Diagnosis not present

## 2021-09-10 DIAGNOSIS — G894 Chronic pain syndrome: Secondary | ICD-10-CM | POA: Diagnosis not present

## 2021-09-10 DIAGNOSIS — M48062 Spinal stenosis, lumbar region with neurogenic claudication: Secondary | ICD-10-CM | POA: Diagnosis not present

## 2021-09-10 DIAGNOSIS — M722 Plantar fascial fibromatosis: Secondary | ICD-10-CM | POA: Diagnosis not present

## 2021-09-10 DIAGNOSIS — M9951 Intervertebral disc stenosis of neural canal of cervical region: Secondary | ICD-10-CM | POA: Diagnosis not present

## 2021-09-10 DIAGNOSIS — R519 Headache, unspecified: Secondary | ICD-10-CM | POA: Diagnosis not present

## 2021-09-10 DIAGNOSIS — M792 Neuralgia and neuritis, unspecified: Secondary | ICD-10-CM | POA: Diagnosis not present

## 2021-09-10 DIAGNOSIS — F419 Anxiety disorder, unspecified: Secondary | ICD-10-CM | POA: Diagnosis not present

## 2021-09-10 DIAGNOSIS — I119 Hypertensive heart disease without heart failure: Secondary | ICD-10-CM | POA: Diagnosis not present

## 2021-09-10 DIAGNOSIS — G8929 Other chronic pain: Secondary | ICD-10-CM | POA: Diagnosis not present

## 2021-09-10 DIAGNOSIS — M545 Low back pain, unspecified: Secondary | ICD-10-CM | POA: Diagnosis not present

## 2021-10-08 DIAGNOSIS — G894 Chronic pain syndrome: Secondary | ICD-10-CM | POA: Diagnosis not present

## 2021-10-08 DIAGNOSIS — M5136 Other intervertebral disc degeneration, lumbar region: Secondary | ICD-10-CM | POA: Diagnosis not present

## 2021-10-08 DIAGNOSIS — M259 Joint disorder, unspecified: Secondary | ICD-10-CM | POA: Diagnosis not present

## 2021-10-08 DIAGNOSIS — M5481 Occipital neuralgia: Secondary | ICD-10-CM | POA: Diagnosis not present

## 2021-11-05 DIAGNOSIS — M5136 Other intervertebral disc degeneration, lumbar region: Secondary | ICD-10-CM | POA: Diagnosis not present

## 2021-11-05 DIAGNOSIS — M722 Plantar fascial fibromatosis: Secondary | ICD-10-CM | POA: Diagnosis not present

## 2021-11-05 DIAGNOSIS — M259 Joint disorder, unspecified: Secondary | ICD-10-CM | POA: Diagnosis not present

## 2021-11-05 DIAGNOSIS — M5481 Occipital neuralgia: Secondary | ICD-10-CM | POA: Diagnosis not present

## 2021-11-05 DIAGNOSIS — M545 Low back pain, unspecified: Secondary | ICD-10-CM | POA: Diagnosis not present

## 2021-11-20 DIAGNOSIS — Z947 Corneal transplant status: Secondary | ICD-10-CM | POA: Diagnosis not present

## 2021-12-03 DIAGNOSIS — M545 Low back pain, unspecified: Secondary | ICD-10-CM | POA: Diagnosis not present

## 2021-12-03 DIAGNOSIS — M722 Plantar fascial fibromatosis: Secondary | ICD-10-CM | POA: Diagnosis not present

## 2021-12-03 DIAGNOSIS — G8929 Other chronic pain: Secondary | ICD-10-CM | POA: Diagnosis not present

## 2021-12-05 DIAGNOSIS — H18232 Secondary corneal edema, left eye: Secondary | ICD-10-CM | POA: Diagnosis not present

## 2021-12-05 DIAGNOSIS — Z947 Corneal transplant status: Secondary | ICD-10-CM | POA: Diagnosis not present

## 2022-06-02 NOTE — Progress Notes (Signed)
06-02-2022 Received a request for medical records from [place] for patient Katrina Reynolds, dob-Oct 28, 1976 for dates of service 10/15/2020 to present. Per [facility] Department ([facility]) medical records department, no medical records available. Patient is not an [facility] patient. Letter mailed to Disability Determination Services, at [facility], [address] [place], [place] [address], advising of the above. Inetta Fermo RN.

## 2024-09-02 ENCOUNTER — Other Ambulatory Visit: Payer: Self-pay | Admitting: *Deleted

## 2024-09-02 DIAGNOSIS — Z1231 Encounter for screening mammogram for malignant neoplasm of breast: Secondary | ICD-10-CM

## 2024-11-22 ENCOUNTER — Ambulatory Visit: Admitting: Family Medicine
